# Patient Record
Sex: Female | Born: 1960 | Race: White | Hispanic: No | Marital: Married | Smoking: Former smoker
Health system: Southern US, Community
[De-identification: ages and names within clinical notes are randomized; demographics above are authoritative.]

## PROBLEM LIST (undated history)

## (undated) DIAGNOSIS — K219 Gastro-esophageal reflux disease without esophagitis: Secondary | ICD-10-CM

## (undated) DIAGNOSIS — D649 Anemia, unspecified: Secondary | ICD-10-CM

## (undated) DIAGNOSIS — M069 Rheumatoid arthritis, unspecified: Secondary | ICD-10-CM

## (undated) DIAGNOSIS — J449 Chronic obstructive pulmonary disease, unspecified: Secondary | ICD-10-CM

## (undated) DIAGNOSIS — F419 Anxiety disorder, unspecified: Secondary | ICD-10-CM

## (undated) DIAGNOSIS — I10 Essential (primary) hypertension: Secondary | ICD-10-CM

## (undated) DIAGNOSIS — N200 Calculus of kidney: Secondary | ICD-10-CM

## (undated) DIAGNOSIS — K649 Unspecified hemorrhoids: Secondary | ICD-10-CM

## (undated) DIAGNOSIS — G709 Myoneural disorder, unspecified: Secondary | ICD-10-CM

## (undated) DIAGNOSIS — F32A Depression, unspecified: Secondary | ICD-10-CM

## (undated) DIAGNOSIS — E785 Hyperlipidemia, unspecified: Secondary | ICD-10-CM

## (undated) DIAGNOSIS — F329 Major depressive disorder, single episode, unspecified: Secondary | ICD-10-CM

## (undated) DIAGNOSIS — E079 Disorder of thyroid, unspecified: Secondary | ICD-10-CM

## (undated) HISTORY — PX: SPINE SURGERY: SHX786

## (undated) HISTORY — DX: Gastro-esophageal reflux disease without esophagitis: K21.9

## (undated) HISTORY — PX: TUBAL LIGATION: SHX77

## (undated) HISTORY — DX: Disorder of thyroid, unspecified: E07.9

## (undated) HISTORY — DX: Anxiety disorder, unspecified: F41.9

## (undated) HISTORY — PX: SHOULDER SURGERY: SHX246

## (undated) HISTORY — DX: Depression, unspecified: F32.A

## (undated) HISTORY — DX: Hyperlipidemia, unspecified: E78.5

## (undated) HISTORY — DX: Rheumatoid arthritis, unspecified: M06.9

## (undated) HISTORY — PX: OTHER SURGICAL HISTORY: SHX169

## (undated) HISTORY — DX: Myoneural disorder, unspecified: G70.9

## (undated) HISTORY — DX: Unspecified hemorrhoids: K64.9

## (undated) HISTORY — DX: Calculus of kidney: N20.0

## (undated) HISTORY — DX: Chronic obstructive pulmonary disease, unspecified: J44.9

## (undated) HISTORY — DX: Essential (primary) hypertension: I10

## (undated) HISTORY — PX: LAPAROTOMY: SHX154

## (undated) HISTORY — PX: BACK SURGERY: SHX140

## (undated) HISTORY — PX: APPENDECTOMY: SHX54

## (undated) HISTORY — PX: CHOLECYSTECTOMY: SHX55

## (undated) HISTORY — DX: Anemia, unspecified: D64.9

## (undated) HISTORY — PX: WISDOM TOOTH EXTRACTION: SHX21

---
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
## 2009-02-01 ENCOUNTER — Emergency Department (HOSPITAL_COMMUNITY): Admission: EM | Admit: 2009-02-01 | Discharge: 2009-02-01 | Payer: Self-pay | Admitting: Emergency Medicine

## 2014-07-17 HISTORY — PX: COLONOSCOPY: SHX174

## 2015-09-11 ENCOUNTER — Other Ambulatory Visit: Payer: Self-pay | Admitting: Endocrinology

## 2015-09-11 DIAGNOSIS — E049 Nontoxic goiter, unspecified: Secondary | ICD-10-CM

## 2015-09-16 ENCOUNTER — Other Ambulatory Visit: Payer: Self-pay

## 2017-06-30 ENCOUNTER — Other Ambulatory Visit: Payer: Self-pay | Admitting: Endocrinology

## 2017-06-30 DIAGNOSIS — E039 Hypothyroidism, unspecified: Secondary | ICD-10-CM

## 2017-06-30 DIAGNOSIS — E049 Nontoxic goiter, unspecified: Secondary | ICD-10-CM

## 2017-07-12 ENCOUNTER — Ambulatory Visit
Admission: RE | Admit: 2017-07-12 | Discharge: 2017-07-12 | Disposition: A | Discharge status: Home / Self Care | Payer: 59 | Source: Ambulatory Visit | Attending: Endocrinology | Admitting: Endocrinology

## 2017-07-12 DIAGNOSIS — E039 Hypothyroidism, unspecified: Secondary | ICD-10-CM

## 2017-07-12 DIAGNOSIS — E049 Nontoxic goiter, unspecified: Secondary | ICD-10-CM

## 2019-07-19 ENCOUNTER — Telehealth: Payer: Self-pay

## 2019-07-19 NOTE — Telephone Encounter (Signed)
Left message to please call  Back. Also called PCP office @ [phone]  to get records faxed, left message on Otila Kluver (nurse) voice mail to please call back. Dr. Havery Moros, does this patient need an office visit with you before her colonoscopy or a Direct book ?

## 2019-07-19 NOTE — Telephone Encounter (Signed)
This patient is Dr. Donneta Romberg Mother-in-law and he called me yesterday and asked me to schedule her for a colonoscopy during the week of 07/31/19 - 08/04/19 with you. I thought the two of you had talked.

## 2019-07-19 NOTE — Telephone Encounter (Signed)
Got it, thanks yes that makes sense now. We did speak about it, I did not know her name at the time. Thanks for letting me know, okay to proceed with colonoscopy. Thanks

## 2019-07-19 NOTE — Telephone Encounter (Signed)
Patient scheduled for Colonoscopy on 08/04/19 at 9:30am with Dr. Havery Moros. Pre-visit on 07/31/19 patient to arrive @ 1:45pm. COVID testing on 08/02/19 at 9:30am at [address]. Patient aware of all the above

## 2019-07-19 NOTE — Telephone Encounter (Signed)
Allison [name] I am a bit confused. Is this patient being referred directly for a colonoscopy? I have not seen her before. If no significant comorbidities can direct book for colonoscopy. If for symptoms or something specifically bothering her she will need to see me in the office first. Thanks

## 2019-07-31 ENCOUNTER — Ambulatory Visit (AMBULATORY_SURGERY_CENTER): Payer: 59 | Admitting: *Deleted

## 2019-07-31 ENCOUNTER — Other Ambulatory Visit: Payer: Self-pay

## 2019-07-31 VITALS — Temp 97.0°F | Ht 62.0 in | Wt 193.0 lb

## 2019-07-31 DIAGNOSIS — Z1211 Encounter for screening for malignant neoplasm of colon: Secondary | ICD-10-CM

## 2019-07-31 DIAGNOSIS — Z1159 Encounter for screening for other viral diseases: Secondary | ICD-10-CM

## 2019-07-31 MED ORDER — SUTAB 1479-225-188 MG PO TABS: 1.0000 | ORAL_TABLET | ORAL | 0 refills | Status: DC

## 2019-07-31 NOTE — Progress Notes (Signed)
Patient denies any allergies to egg or soy products. Patient denies complications with anesthesia/sedation.  Patient denies oxygen use at home and denies diet medications. Emmi instructions for colonoscopy/endoscopy explained and given to patient.   

## 2019-08-02 ENCOUNTER — Ambulatory Visit (INDEPENDENT_AMBULATORY_CARE_PROVIDER_SITE_OTHER): Payer: 59

## 2019-08-02 ENCOUNTER — Encounter: Payer: Self-pay | Admitting: Gastroenterology

## 2019-08-02 DIAGNOSIS — Z1159 Encounter for screening for other viral diseases: Secondary | ICD-10-CM

## 2019-08-02 LAB — SARS CORONAVIRUS 2 (TAT 6-24 HRS): SARS Coronavirus 2: NEGATIVE

## 2019-08-04 ENCOUNTER — Encounter: Payer: Self-pay | Admitting: Gastroenterology

## 2019-08-04 ENCOUNTER — Ambulatory Visit (AMBULATORY_SURGERY_CENTER): Payer: 59 | Admitting: Gastroenterology

## 2019-08-04 ENCOUNTER — Other Ambulatory Visit: Payer: Self-pay

## 2019-08-04 ENCOUNTER — Telehealth: Payer: Self-pay | Admitting: Gastroenterology

## 2019-08-04 VITALS — BP 112/64 | HR 63 | Temp 97.3°F | Resp 18 | Ht 62.0 in | Wt 193.0 lb

## 2019-08-04 DIAGNOSIS — D123 Benign neoplasm of transverse colon: Secondary | ICD-10-CM

## 2019-08-04 DIAGNOSIS — K635 Polyp of colon: Secondary | ICD-10-CM | POA: Diagnosis not present

## 2019-08-04 DIAGNOSIS — Z86010 Personal history of colonic polyps: Secondary | ICD-10-CM

## 2019-08-04 DIAGNOSIS — K621 Rectal polyp: Secondary | ICD-10-CM

## 2019-08-04 DIAGNOSIS — D128 Benign neoplasm of rectum: Secondary | ICD-10-CM

## 2019-08-04 DIAGNOSIS — D122 Benign neoplasm of ascending colon: Secondary | ICD-10-CM

## 2019-08-04 MED ORDER — SODIUM CHLORIDE 0.9 % IV SOLN: 500.0000 mL | Freq: Once | INTRAVENOUS | Status: DC

## 2019-08-04 MED ORDER — AMBULATORY NON FORMULARY MEDICATION: 1 refills | Status: DC

## 2019-08-04 MED ORDER — AMBULATORY NON FORMULARY MEDICATION: 2 refills | Status: DC

## 2019-08-04 NOTE — Telephone Encounter (Signed)
Pts NTG gel was sent to wrong pharmacy resent and rx for Nitrogylcerin gel printed for her to take to [facility]. Patient is coming back to pickup the rx.

## 2019-08-04 NOTE — Op Note (Signed)
[facility] Patient Name: Allison [name] Procedure Date: 08/04/2019 9:47 AM MRN: [id number] Endoscopist: Remo Lipps P. Havery Moros , MD Age: 59 Referring MD:  Date of Birth: [birth year] Gender: Female Account #: [id number] Procedure:                Colonoscopy Indications:              High risk colon cancer surveillance: Personal                            history of colonic polyps (adenoma removed in 2016                            - 1cm in size) Medicines:                Monitored Anesthesia Care Procedure:                Pre-Anesthesia Assessment:                           - Prior to the procedure, a History and Physical                            was performed, and patient medications and                            allergies were reviewed. The patient's tolerance of                            previous anesthesia was also reviewed. The risks                            and benefits of the procedure and the sedation                            options and risks were discussed with the patient.                            All questions were answered, and informed consent                            was obtained. Prior Anticoagulants: The patient has                            taken no previous anticoagulant or antiplatelet                            agents. ASA Grade Assessment: II - A patient with                            mild systemic disease. After reviewing the risks                            and benefits, the patient was deemed in  satisfactory condition to undergo the procedure.                           After obtaining informed consent, the colonoscope                            was passed under direct vision. Throughout the                            procedure, the patient's blood pressure, pulse, and                            oxygen saturations were monitored continuously. The                            Colonoscope was introduced through the anus  and                            advanced to the the terminal ileum, with                            identification of the appendiceal orifice and IC                            valve. The colonoscopy was performed without                            difficulty. The patient tolerated the procedure                            well. The quality of the bowel preparation was                            good. The terminal ileum, ileocecal valve,                            appendiceal orifice, and rectum were photographed. Scope In: 9:50:58 AM Scope Out: 10:26:53 AM Scope Withdrawal Time: 0 hours 30 minutes 30 seconds  Total Procedure Duration: 0 hours 35 minutes 55 seconds  Findings:                 Skin tags were found on perianal exam, one large.                            Small anal fissure noted as well.                           The terminal ileum appeared normal.                           A 10 mm polyp was found in the ascending colon. The                            polyp was sessile and located just distal to the  IC                            valve on the back side of a fold. Positioning was                            challenging to be able to grasp and remove it with                            regular snare, could not be seen in retroflexed                            views. Captivator snare was ultimately used to be                            able to grasp it and then removed successfully.                            Resection and retrieval were complete.                           A 6 mm polyp was found in the transverse colon. The                            polyp was flat. The polyp was removed with a cold                            snare. Resection and retrieval were complete.                           Two sessile polyps were found in the rectum. The                            polyps were 3 mm in size. These polyps were removed                            with a cold snare. Resection and  retrieval were                            complete.                           Multiple small-mouthed diverticula were found in                            the sigmoid colon.                           Internal hemorrhoids were found during                            retroflexion. The hemorrhoids were small. Small  anal fissure visualized endoscopically during                            withdrawal.                           The exam was otherwise without abnormality. Complications:            No immediate complications. Estimated blood loss:                            Minimal. Estimated Blood Loss:     Estimated blood loss was minimal. Impression:               - Perianal skin tags found on perianal exam.                           - Small anal fissure                           - The examined portion of the ileum was normal.                           - One 10 mm polyp in the ascending colon, removed                            with a cold snare. Resected and retrieved.                           - One 6 mm polyp in the transverse colon, removed                            with a cold snare. Resected and retrieved.                           - Two 3 mm polyps in the rectum, removed with a                            cold snare. Resected and retrieved.                           - Diverticulosis in the sigmoid colon.                           - Internal hemorrhoids.                           - The examination was otherwise normal. Recommendation:           - Patient has a contact number available for                            emergencies. The signs and symptoms of potential                            delayed complications were discussed with the  patient. Return to normal activities tomorrow.                            Written discharge instructions were provided to the                            patient.                           - Resume  previous diet.                           - Continue present medications.                           - Await pathology results.                           - Recommend topical 0.125% nitroglycerin ointment                            to treat fissure if bleeding symptoms / pain                            bothering the patient. Remo Lipps P. Armbruster, MD 08/04/2019 10:36:22 AM This report has been signed electronically.

## 2019-08-04 NOTE — Progress Notes (Signed)
A and O x3. Report to RN. Tolerated MAC anesthesia well.

## 2019-08-04 NOTE — Progress Notes (Signed)
Called to room to assist during endoscopic procedure.  Patient ID and intended procedure confirmed with present staff. Received instructions for my participation in the procedure from the performing physician.  

## 2019-08-04 NOTE — Progress Notes (Signed)
Pt's states no medical or surgical changes since previsit or office visit. 

## 2019-08-04 NOTE — Progress Notes (Signed)
LC- temp CW- vitals

## 2019-08-04 NOTE — Telephone Encounter (Signed)
Please send nitroglycerin rx to [facility].  It had been sent to [facility].

## 2019-08-04 NOTE — Patient Instructions (Signed)
Impression/Recommendations:  Polyp handout given to patient. Diverticulosis handout given to patient. Hemorrhoid handout given to patient.  Resume previous diet. Continue present medications.  Await pathology results.  Recommend topical 0.125% nitroglycerin ointment to treat fissure if bleeding symptoms/pain bother the patient.  YOU HAD AN ENDOSCOPIC PROCEDURE TODAY AT [facility]:   Refer to the procedure report that was given to you for any specific questions about what was found during the examination.  If the procedure report does not answer your questions, please call your gastroenterologist to clarify.  If you requested that your care partner not be given the details of your procedure findings, then the procedure report has been included in a sealed envelope for you to review at your convenience later.  YOU SHOULD EXPECT: Some feelings of bloating in the abdomen. Passage of more gas than usual.  Walking can help get rid of the air that was put into your GI tract during the procedure and reduce the bloating. If you had a lower endoscopy (such as a colonoscopy or flexible sigmoidoscopy) you may notice spotting of blood in your stool or on the toilet paper. If you underwent a bowel prep for your procedure, you may not have a normal bowel movement for a few days.  Please Note:  You might notice some irritation and congestion in your nose or some drainage.  This is from the oxygen used during your procedure.  There is no need for concern and it should clear up in a day or so.  SYMPTOMS TO REPORT IMMEDIATELY:   Following lower endoscopy (colonoscopy or flexible sigmoidoscopy):  Excessive amounts of blood in the stool  Significant tenderness or worsening of abdominal pains  Swelling of the abdomen that is new, acute  Fever of 100F or higher  For urgent or emergent issues, a gastroenterologist can be reached at any hour by calling [phone]. Do not use MyChart  messaging for urgent concerns.    DIET:  We do recommend a small meal at first, but then you may proceed to your regular diet.  Drink plenty of fluids but you should avoid alcoholic beverages for 24 hours.  ACTIVITY:  You should plan to take it easy for the rest of today and you should NOT DRIVE or use heavy machinery until tomorrow (because of the sedation medicines used during the test).    FOLLOW UP: Our staff will call the number listed on your records 48-72 hours following your procedure to check on you and address any questions or concerns that you may have regarding the information given to you following your procedure. If we do not reach you, we will leave a message.  We will attempt to reach you two times.  During this call, we will ask if you have developed any symptoms of COVID 19. If you develop any symptoms (ie: fever, flu-like symptoms, shortness of breath, cough etc.) before then, please call [phone].  If you test positive for Covid 19 in the 2 weeks post procedure, please call and report this information to [place].    If any biopsies were taken you will be contacted by phone or by letter within the next 1-3 weeks.  Please call us at [phone] if you have not heard about the biopsies in 3 weeks.    SIGNATURES/CONFIDENTIALITY: You and/or your care partner have signed paperwork which will be entered into your electronic medical record.  These signatures attest to the fact that that the information above on your  After Visit Summary has been reviewed and is understood.  Full responsibility of the confidentiality of this discharge information lies with you and/or your care-partner.

## 2019-08-04 NOTE — Addendum Note (Signed)
Addended by: Lexine Baton E on: 08/04/2019 04:24 PM   Modules accepted: Orders

## 2019-08-08 ENCOUNTER — Telehealth: Payer: Self-pay

## 2019-08-08 NOTE — Telephone Encounter (Signed)
  Follow up Call-  Call back number 08/04/2019  Post procedure Call Back phone  # [phone]  Permission to leave phone message Yes  Some recent data might be hidden     Patient questions:  Do you have a fever, pain , or abdominal swelling? No. Pain Score  0 *  Have you tolerated food without any problems? Yes.    Have you been able to return to your normal activities? Yes.    Do you have any questions about your discharge instructions: Diet   No. Medications  No. Follow up visit  No.  Do you have questions or concerns about your Care? No.  Actions: * If pain score is 4 or above: No action needed, pain <4.  1. Have you developed a fever since your procedure? no  2.   Have you had an respiratory symptoms (SOB or cough) since your procedure? no  3.   Have you tested positive for COVID 19 since your procedure no  4.   Have you had any family members/close contacts diagnosed with the COVID 19 since your procedure?  no   If yes to any of these questions please route to Joylene John, RN and Alphonsa Gin, Therapist, sports.

## 2020-10-22 ENCOUNTER — Ambulatory Visit: Payer: 59 | Admitting: Family Medicine

## 2020-10-23 NOTE — Progress Notes (Signed)
[place] Phone: [phone] Subjective:   Allison [name], am serving as a scribe for Dr. Hulan Saas. This visit occurred during the SARS-CoV-2 public health emergency.  Safety protocols were in place, including screening questions prior to the visit, additional usage of staff PPE, and extensive cleaning of exam room while observing appropriate contact time as indicated for disinfecting solutions.    I'm seeing this patient by the request  of:  Hodnett, Jacobo Forest, FNP  CC: Hand pain and swelling and multiple other complaints.  XNA:[id number]  Allison [name] is a 60 y.o. female coming in with complaint of B hand, R>L. Wakes up in morning and fingers will be flexed slightly in most joints. Has noticed swelling in hands for past year that is getting worse. Notes numbness in R thumb. Does still work and uses computer throughout the day. Has been trying compression braces, ice, topical analgesic and NSAIDS for pain relief. Patient had ulnar nerve moved over 10 years ago on R side. Does still have ulnar nerve pain. History of bursitis surgery over 10 years ago R shoulder. History of injections in neck by neurologist.  Patient is looking for anything to potentially help.  Has been on different medications.  At this point he is taking Cymbalta and 20 mg twice daily, gabapentin.  Also notes swelling in ankles and toes that gets worse throughout the day since December 2021.   Patient did have labs previously that did show patient had an elevated rheumatoid factor.  Patient's level was 48 drawn on Oct 06, 2020  Past Medical History:  Diagnosis Date   Anxiety    Depression    GERD (gastroesophageal reflux disease)    diet controlled , tums occasional   Hemorrhoids    Hyperlipidemia    Kidney stones    Thyroid disease    Past Surgical History:  Procedure Laterality Date   APPENDECTOMY     arm surgery Right    nerve moved    BACK SURGERY     x 2 - lumbar - replaced 2 disc   CHOLECYSTECTOMY     COLONOSCOPY  07/2014   hx polyps [place], [place]   kidney stones     x 2 - removed by surgery   LAPAROTOMY     cyst removed from ovary   TUBAL LIGATION     WISDOM TOOTH EXTRACTION     Social History   Socioeconomic History   Marital status: Married    Spouse name: Not on file   Number of children: Not on file   Years of education: Not on file   Highest education level: Not on file  Occupational History   Not on file  Tobacco Use   Smoking status: Former    Packs/day: 0.50    Years: 40.00    Pack years: 20.00    Types: Cigarettes    Quit date: 2020    Years since quitting: 2.4   Smokeless tobacco: Never  Vaping Use   Vaping Use: Never used  Substance and Sexual Activity   Alcohol use: Yes    Comment: socially   Drug use: Never   Sexual activity: Not on file  Other Topics Concern   Not on file  Social History Narrative   Not on file   Social Determinants of Health   Financial Resource Strain: Not on file  Food Insecurity: Not on file  Transportation Needs: Not on file  Physical Activity: Not on file  Stress: Not on file  Social Connections: Not on file   Allergies  Allergen Reactions   Ezetimibe Other (See Comments)   Latex Itching   Penicillin G Hives   Sulfa Antibiotics Hives   Family History  Problem Relation Age of Onset   Colon cancer Neg Hx    Rectal cancer Neg Hx    Stomach cancer Neg Hx     Current Outpatient Medications (Endocrine & Metabolic):    levothyroxine (SYNTHROID) 175 MCG tablet, 1 tab(s)  Current Outpatient Medications (Cardiovascular):    atorvastatin (LIPITOR) 80 MG tablet, TAKE 1 TABLET BY MOUTH ONE TIME DAILY AT BEDTIME   Enalapril-hydroCHLOROthiazide 5-12.5 MG tablet, 1 tab(s)   Current Outpatient Medications (Analgesics):    aspirin 81 MG EC tablet, TAKE 1 TABLET BY MOUTH EVERY DAY   Current Outpatient Medications (Other):    ALPRAZolam (XANAX)  0.5 MG tablet, 1 tab(s)   DULoxetine (CYMBALTA) 20 MG capsule, Take 20 mg by mouth daily.   gabapentin (NEURONTIN) 300 MG/6ML solution, 1 cap(s)   sertraline (ZOLOFT) 100 MG tablet, 1 tab(s)   AMBULATORY NON FORMULARY MEDICATION, Medication Name: Nitroglycerin 0.125% gel. Apply pea size amount to the rectum three times a day for 6-8 weeks   hydrocortisone (ANUCORT-HC) 25 MG suppository, 1 SUPP(s)   Reviewed prior external information including notes and imaging from  primary care provider As well as notes that were available from care everywhere and other healthcare systems.  Past medical history, social, surgical and family history all reviewed in electronic medical record.  [name] pertanent information unless stated regarding to the chief complaint.   Review of Systems:  [name] headache, visual changes, nausea, vomiting, diarrhea, constipation, dizziness, abdominal pain, skin rash, fevers, chills, night sweats, weight loss, swollen lymph nodes, chest pain, shortness of breath, mood changes. POSITIVE muscle aches, body aches, joint swelling of the hands  Objective  Blood pressure 134/84, pulse 93, height 5\' 2"  (1.575 m), weight 189 lb (85.7 kg), SpO2 97 %.   General: [name] apparent distress alert and oriented x3 mood and affect normal, dressed appropriately.  HEENT: Pupils equal, extraocular movements intact  Respiratory: Patient's speak in full sentences and does not appear short of breath  Cardiovascular: [name] lower extremity edema, non tender, [name] erythema  Gait normal with good balance and coordination.  MSK: Patient's hand exam bilaterally show the patient.  The right elbow did have a formal surgery  Patient still has tenderness in the forearm.  Mild positive Tinel's test bilaterally. [name] significant swelling noted at this time. Decent grip strength. Patient's neck exam does have fairly good range of motion while patient is talking.  Seems to have some very mild limited sidebending.  Limited  musculoskeletal ultrasound was performed and interpreted by Lyndal Pulley  Limited ultrasound of patient's wrist bilaterally show [name] significant bony abnormality.  [name] synovitis noted.  Patient does have very mild enlargement of the median nerve bilaterally.  Patient does have what appears to be hypoechoic changes surrounding the median nerve on the right side that is consistent with potential scarring.  Otherwise ultrasound fairly unremarkable. Impression: Questionable carpal tunnel bilaterally   Impression and Recommendations:     The above documentation has been reviewed and is accurate and complete Lyndal Pulley, DO

## 2020-10-24 ENCOUNTER — Ambulatory Visit: Payer: Self-pay

## 2020-10-24 ENCOUNTER — Other Ambulatory Visit: Payer: Self-pay

## 2020-10-24 ENCOUNTER — Ambulatory Visit (INDEPENDENT_AMBULATORY_CARE_PROVIDER_SITE_OTHER): Payer: 59 | Admitting: Family Medicine

## 2020-10-24 ENCOUNTER — Ambulatory Visit (INDEPENDENT_AMBULATORY_CARE_PROVIDER_SITE_OTHER): Payer: 59

## 2020-10-24 ENCOUNTER — Encounter: Payer: Self-pay | Admitting: Family Medicine

## 2020-10-24 VITALS — BP 134/84 | HR 93 | Ht 62.0 in | Wt 189.0 lb

## 2020-10-24 DIAGNOSIS — M25532 Pain in left wrist: Secondary | ICD-10-CM

## 2020-10-24 DIAGNOSIS — M542 Cervicalgia: Secondary | ICD-10-CM | POA: Diagnosis not present

## 2020-10-24 DIAGNOSIS — M2550 Pain in unspecified joint: Secondary | ICD-10-CM | POA: Diagnosis not present

## 2020-10-24 DIAGNOSIS — M25531 Pain in right wrist: Secondary | ICD-10-CM | POA: Diagnosis not present

## 2020-10-24 DIAGNOSIS — M25539 Pain in unspecified wrist: Secondary | ICD-10-CM | POA: Insufficient documentation

## 2020-10-24 NOTE — Patient Instructions (Signed)
Good to see you.  Labs today B EMG UE Exercises [email or web address] Turmeric 500mg  daily  Tart cherry extract 1200mg  at night Vitamin D 2000 IU daily  See me again in 4-6 weeks

## 2020-10-24 NOTE — Assessment & Plan Note (Signed)
Patient has many different aches and pains.  Has had an elevated rheumatoid factor previously and I would like to recheck labs when further labs to see if anything else is potentially abnormal.  We discussed the potential for the possibility of an injection in the carpal tunnel which patient at this moment would rather go with a nerve conduction test.  I think this is reasonable.  Patient's ultrasound did not show significant enlargement of the median nerves bilaterally but did have some potential scarring right greater than left.  We did discuss the potential for changing medications such as the Cymbalta and increasing this and decreasing her sertraline.  At the moment we will hold and try more of the counter natural supplementations and see how patient will respond.  Patient continue to be active otherwise.  Discussed different compression sleeves and home exercises and follow-up again in 4 to 8 weeks

## 2020-10-24 NOTE — Assessment & Plan Note (Signed)
Bilateral.  Discussed potential carpal tunnel, awaiting new nerve conduction test.  Has also had cervical radiculopathy in her past and has had epidurals in there previously.  Patient is already on gabapentin.  Will do compression.  Worsening pain and if nerve conduction shows more carpal tunnel will do injection at follow-up

## 2020-10-25 ENCOUNTER — Encounter: Payer: Self-pay | Admitting: Neurology

## 2020-10-25 LAB — CBC WITH DIFFERENTIAL/PLATELET
Basophils Absolute: 0.1 10*3/uL (ref 0.0–0.1)
Basophils Relative: 1 % (ref 0.0–3.0)
Eosinophils Absolute: 0.4 10*3/uL (ref 0.0–0.7)
Eosinophils Relative: 3.5 % (ref 0.0–5.0)
HCT: 42.8 % (ref 36.0–46.0)
Hemoglobin: 14.6 g/dL (ref 12.0–15.0)
Lymphocytes Relative: 37.2 % (ref 12.0–46.0)
Lymphs Abs: 4 10*3/uL (ref 0.7–4.0)
MCHC: 34.1 g/dL (ref 30.0–36.0)
MCV: 81.1 fl (ref 78.0–100.0)
Monocytes Absolute: 0.8 10*3/uL (ref 0.1–1.0)
Monocytes Relative: 8 % (ref 3.0–12.0)
Neutro Abs: 5.4 10*3/uL (ref 1.4–7.7)
Neutrophils Relative %: 50.3 % (ref 43.0–77.0)
Platelets: 295 10*3/uL (ref 150.0–400.0)
RBC: 5.28 Mil/uL — ABNORMAL HIGH (ref 3.87–5.11)
RDW: 13.8 % (ref 11.5–15.5)
WBC: 10.6 10*3/uL — ABNORMAL HIGH (ref 4.0–10.5)

## 2020-10-25 LAB — COMPREHENSIVE METABOLIC PANEL
ALT: 20 U/L (ref 0–35)
AST: 15 U/L (ref 0–37)
Albumin: 4.3 g/dL (ref 3.5–5.2)
Alkaline Phosphatase: 89 U/L (ref 39–117)
BUN: 20 mg/dL (ref 6–23)
CO2: 24 mEq/L (ref 19–32)
Calcium: 9.8 mg/dL (ref 8.4–10.5)
Chloride: 108 mEq/L (ref 96–112)
Creatinine, Ser: 0.84 mg/dL (ref 0.40–1.20)
GFR: 74.63 mL/min (ref >60.00)
Glucose, Bld: 76 mg/dL (ref 70–99)
Potassium: 4 mEq/L (ref 3.5–5.1)
Sodium: 142 mEq/L (ref 135–145)
Total Bilirubin: 0.2 mg/dL (ref 0.2–1.2)
Total Protein: 7.5 g/dL (ref 6.0–8.3)

## 2020-10-25 LAB — SEDIMENTATION RATE: Sed Rate: 24 mm/hr (ref 0–30)

## 2020-10-25 LAB — C-REACTIVE PROTEIN: CRP: <1 mg/dL (ref 0.5–20.0)

## 2020-10-25 LAB — IBC PANEL
Iron: 28 ug/dL — ABNORMAL LOW (ref 42–145)
Saturation Ratios: 7.8 % — ABNORMAL LOW (ref 20.0–50.0)
Transferrin: 258 mg/dL (ref 212.0–360.0)

## 2020-10-25 LAB — URIC ACID: Uric Acid, Serum: 3.9 mg/dL (ref 2.4–7.0)

## 2020-10-25 LAB — VITAMIN D 25 HYDROXY (VIT D DEFICIENCY, FRACTURES): VITD: 28.5 ng/mL — ABNORMAL LOW (ref 30.00–100.00)

## 2020-10-25 LAB — TSH: TSH: 0.19 u[IU]/mL — ABNORMAL LOW (ref 0.35–4.50)

## 2020-10-26 LAB — ANTI-NUCLEAR AB-TITER (ANA TITER)
ANA TITER: 1:40 {titer} — ABNORMAL HIGH
ANA Titer 1: 1:40 {titer} — ABNORMAL HIGH

## 2020-10-26 LAB — CALCIUM, IONIZED: Calcium, Ion: 5.22 mg/dL (ref 4.8–5.6)

## 2020-10-26 LAB — PTH, INTACT AND CALCIUM
Calcium: 10.1 mg/dL (ref 8.6–10.4)
PTH: 25 pg/mL (ref 16–77)

## 2020-10-26 LAB — ANGIOTENSIN CONVERTING ENZYME: Angiotensin-Converting Enzyme: 52 U/L (ref 9–67)

## 2020-10-26 LAB — ANA: Anti Nuclear Antibody (ANA): POSITIVE — AB

## 2020-10-26 LAB — CYCLIC CITRUL PEPTIDE ANTIBODY, IGG: Cyclic Citrullin Peptide Ab: >250 UNITS — ABNORMAL HIGH

## 2020-10-26 LAB — RHEUMATOID FACTOR: Rheumatoid fact SerPl-aCnc: 268 IU/mL — ABNORMAL HIGH (ref <14)

## 2020-10-27 ENCOUNTER — Encounter: Payer: Self-pay | Admitting: Family Medicine

## 2020-10-28 ENCOUNTER — Other Ambulatory Visit: Payer: Self-pay

## 2020-10-28 ENCOUNTER — Encounter: Payer: Self-pay | Admitting: Family Medicine

## 2020-10-28 DIAGNOSIS — R768 Other specified abnormal immunological findings in serum: Secondary | ICD-10-CM

## 2020-10-28 DIAGNOSIS — R9389 Abnormal findings on diagnostic imaging of other specified body structures: Secondary | ICD-10-CM

## 2020-11-29 ENCOUNTER — Ambulatory Visit
Admission: RE | Admit: 2020-11-29 | Discharge: 2020-11-29 | Disposition: A | Discharge status: Home / Self Care | Payer: 59 | Source: Ambulatory Visit | Attending: Family Medicine | Admitting: Family Medicine

## 2020-11-29 DIAGNOSIS — R9389 Abnormal findings on diagnostic imaging of other specified body structures: Secondary | ICD-10-CM

## 2020-12-03 ENCOUNTER — Ambulatory Visit (INDEPENDENT_AMBULATORY_CARE_PROVIDER_SITE_OTHER): Payer: 59 | Admitting: Neurology

## 2020-12-03 ENCOUNTER — Other Ambulatory Visit: Payer: Self-pay

## 2020-12-03 DIAGNOSIS — R202 Paresthesia of skin: Secondary | ICD-10-CM

## 2020-12-03 NOTE — Procedures (Signed)
[facility]  [place], [place]  [place], [place] [address] Tel: [phone] Fax:  [phone] Test Date:  12/03/2020  Patient: Allison [name] DOB: Mar 09, 1961 Physician: Narda Amber, DO  Sex: Female Height: 5\' 2"  Ref Phys: Hulan Saas, DO  ID#: [id number]   Technician:    Patient Complaints: This is a 60 year old female referred for evaluation of bilateral hand and wrist pain.  NCV & EMG Findings: Extensive electrodiagnostic testing of the right upper extremity and additional studies of the left shows: Bilateral median, ulnar, and mixed palmar sensory responses are within normal limits. Bilateral median and ulnar motor responses are within normal limits. There is no evidence of active or chronic motor axonal loss changes affecting any of the tested muscles.  Motor unit configuration and recruitment pattern is within normal limits.  Impression: This is a normal study of the upper extremities.  In particular, there is no evidence of carpal tunnel syndrome, ulnar neuropathy, or a cervical radiculopathy.   ___________________________ Narda Amber, DO    Nerve Conduction Studies Anti Sensory Summary Table   Stim Site NR Peak (ms) Norm Peak (ms) P-T Amp (V) Norm P-T Amp  Left Median Anti Sensory (2nd Digit)  35C  Wrist    2.6 <3.8 30.2 >10  Right Median Anti Sensory (2nd Digit)  35C  Wrist    2.6 <3.8 26.3 >10  Left Ulnar Anti Sensory (5th Digit)  35C  Wrist    2.3 <3.2 27.7 >5  Right Ulnar Anti Sensory (5th Digit)  35C  Wrist    2.1 <3.2 29.3 >5   Motor Summary Table   Stim Site NR Onset (ms) Norm Onset (ms) O-P Amp (mV) Norm O-P Amp Site1 Site2 Delta-0 (ms) Dist (cm) Vel (m/s) Norm Vel (m/s)  Left Median Motor (Abd Poll Brev)  35C  Wrist    2.7 <4.0 8.7 >5 Elbow Wrist 4.4 27.0 61 >50  Elbow    7.1  8.7         Right Median Motor (Abd Poll Brev)  35C  Wrist    3.0 <4.0 8.6 >5 Elbow Wrist 5.0 28.0 56 >50  Elbow    8.0  8.2         Left Ulnar Motor  (Abd Dig Minimi)  35C  Wrist    1.8 <3.1 9.4 >7 B Elbow Wrist 3.1 20.0 65 >50  B Elbow    4.9  8.7  A Elbow B Elbow 1.7 10.0 59 >50  A Elbow    6.6  8.7         Right Ulnar Motor (Abd Dig Minimi)  35C  Wrist    2.0 <3.1 8.8 >7 B Elbow Wrist 3.2 20.0 63 >50  B Elbow    5.2  7.0  A Elbow B Elbow 1.3 9.0 69 >50  A Elbow    6.5  6.9          Comparison Summary Table   Stim Site NR Peak (ms) Norm Peak (ms) P-T Amp (V) Site1 Site2 Delta-P (ms) Norm Delta (ms)  Left Median/Ulnar Palm Comparison (Wrist - 8cm)  35C  Median Palm    1.6 <2.2 71.9 Median Palm Ulnar Palm 0.3   Ulnar Palm    1.3 <2.2 18.8      Right Median/Ulnar Palm Comparison (Wrist - 8cm)  35C  Median Palm    1.6 <2.2 71.8 Median Palm Ulnar Palm 0.3   Ulnar Palm    1.3 <2.2 25.7  EMG   Side Muscle Ins Act Fibs Psw Fasc Number Recrt Dur Dur. Amp Amp. Poly Poly. Comment  Left 1stDorInt Nml Nml Nml Nml Nml Nml Nml Nml Nml Nml Nml Nml N/A  Left PronatorTeres Nml Nml Nml Nml Nml Nml Nml Nml Nml Nml Nml Nml N/A  Left Biceps Nml Nml Nml Nml Nml Nml Nml Nml Nml Nml Nml Nml N/A  Left Triceps Nml Nml Nml Nml Nml Nml Nml Nml Nml Nml Nml Nml N/A  Left Deltoid Nml Nml Nml Nml Nml Nml Nml Nml Nml Nml Nml Nml N/A  Right PronatorTeres Nml Nml Nml Nml Nml Nml Nml Nml Nml Nml Nml Nml N/A  Right Biceps Nml Nml Nml Nml Nml Nml Nml Nml Nml Nml Nml Nml N/A  Right Triceps Nml Nml Nml Nml Nml Nml Nml Nml Nml Nml Nml Nml N/A  Right Deltoid Nml Nml Nml Nml Nml Nml Nml Nml Nml Nml Nml Nml N/A  Right 1stDorInt Nml Nml Nml Nml Nml Nml Nml Nml Nml Nml Nml Nml N/A     Waveforms:

## 2020-12-04 NOTE — Progress Notes (Signed)
Office Visit Note  Patient: Allison [name]             Date of Birth: 10-02-1960           MRN: [id number]             PCP: Dicie Beam, FNP Referring: Lyndal Pulley, DO Visit Date: 12/05/2020 Occupation: [facility]  Subjective:  New Patient (Initial Visit) (Patient complains of bilateral hand pain (right(dominant)>left) and bilateral foot pain. )   History of Present Illness: Allison [name] is a 60 y.o. female here for multiple joint pains including bilateral hands. She was also referred for NCS this did not demonstrate abnormalities consistent with her symptoms.  She has had joint pains to some degree for a while but particularly noticed increased pain and stiffness decreased range of motion and intermittent swelling in joints of several fingers during the past few months.  She is also noticed increase in bilateral pain on the bottoms of the feet that is worst first thing in the morning when weightbearing and improves walking throughout the day.  Labs reviewed 10/2020 ANA 1:40 speckled, cytoplasmic ACE 52 CBC unremarkable CMP wnl CRP wnl RF 268 CCP >250 TSH 0.19 Uric acid 3.9 Iron sat 7.8% PTH wnl  Activities of Daily Living:  Patient reports morning stiffness for 15 minutes.   Patient Reports nocturnal pain.  Difficulty dressing/grooming: Denies Difficulty climbing stairs: Denies Difficulty getting out of chair: Denies Difficulty using hands for taps, buttons, cutlery, and/or writing: Reports  Review of Systems  Constitutional:  Negative for fatigue.  HENT:  Negative for mouth sores, mouth dryness and nose dryness.   Eyes:  Negative for pain, itching, visual disturbance and dryness.  Respiratory:  Positive for cough. Negative for hemoptysis, shortness of breath and difficulty breathing.   Cardiovascular:  Positive for swelling in legs/feet. Negative for chest pain and palpitations.  Gastrointestinal:  Negative for abdominal pain, blood in stool, constipation and diarrhea.   Endocrine: Negative for increased urination.  Genitourinary:  Negative for painful urination.  Musculoskeletal:  Positive for joint pain, joint pain, joint swelling, myalgias, muscle weakness, morning stiffness, muscle tenderness and myalgias.  Skin:  Negative for color change, rash and redness.  Allergic/Immunologic: Negative for susceptible to infections.  Neurological:  Positive for numbness. Negative for dizziness, headaches, memory loss and weakness.  Hematological:  Negative for swollen glands.  Psychiatric/Behavioral:  Negative for confusion and sleep disturbance.    PMFS History:  Patient Active Problem List   Diagnosis Date Noted   Rheumatoid arthritis with rheumatoid factor of multiple sites without organ or systems involvement ([place]) 12/05/2020   Iron deficiency 12/05/2020   Plantar fasciitis, bilateral 12/05/2020   Positive ANA (antinuclear antibody) 12/05/2020   High risk medication use 12/05/2020   Polyarthralgia 10/24/2020   Wrist pain 10/24/2020    Past Medical History:  Diagnosis Date   Anxiety    Depression    GERD (gastroesophageal reflux disease)    diet controlled , tums occasional   Hemorrhoids    Hyperlipidemia    Kidney stones    Thyroid disease     Family History  Problem Relation Age of Onset   Asthma Mother    Heart disease Father    Osteoarthritis Sister    Heart disease Brother    Thyroid disease Daughter    Colon cancer Neg Hx    Rectal cancer Neg Hx    Stomach cancer Neg Hx    Past Surgical History:  Procedure Laterality Date  APPENDECTOMY     arm surgery Right    nerve moved   BACK SURGERY     x 2 - lumbar - replaced 2 disc   CHOLECYSTECTOMY     COLONOSCOPY  07/2014   hx polyps [place]   kidney stones     x 2 - removed by surgery   LAPAROTOMY     cyst removed from ovary   SHOULDER SURGERY Right    [place] EXTRACTION     Social History   Social History Narrative   Not on file    Immunization History  Administered Date(s) Administered   Moderna SARS-COV2 Booster Vaccination 04/07/2020, 09/06/2020   Moderna Sars-Covid-2 Vaccination 08/30/2019, 09/27/2019     Objective: Vital Signs: BP 103/68 (BP Location: Right Arm, Patient Position: Sitting, Cuff Size: Normal)   Pulse (!) 111   Ht 5\' 3"  (1.6 m)   Wt 187 lb 9.6 oz (85.1 kg)   BMI 33.23 kg/m    Physical Exam Constitutional:      Appearance: She is obese.  Eyes:     Conjunctiva/sclera: Conjunctivae normal.  Cardiovascular:     Rate and Rhythm: Normal rate and regular rhythm.  Pulmonary:     Effort: Pulmonary effort is normal.     Breath sounds: Normal breath sounds.  Skin:    General: Skin is warm and dry.     Findings: No rash.  Neurological:     General: No focal deficit present.     Mental Status: She is alert.  Psychiatric:        Mood and Affect: Mood normal.     Musculoskeletal Exam:  Shoulders full ROM no tenderness or swelling Elbows full ROM no tenderness or swelling Wrists full ROM no tenderness or swelling Fingers right second MCP decreased range of motion tenderness to palpation effusion and synovial hypertrophy present on ultrasound inspection with no color Doppler enhancement, other joints normal ROM nontender Knees full ROM no tenderness or swelling Ankles full ROM no tenderness or swelling Mild tenderness to pressure on sole of foot under margin and anterior border of calcaneus no palpable nodules Negative MTP squeeze tenderness   CDAI Exam: CDAI Score: -- Patient Global: --; Provider Global: -- Swollen: 1 ; Tender: 1  Joint Exam 12/05/2020      Right  Left  MCP 2  Swollen Tender        Investigation: No additional findings.  Imaging: US Carotid Bilateral  Result Date: 11/29/2020 CLINICAL DATA:  Hypertension, carotid atherosclerosis by plain radiography EXAM: BILATERAL CAROTID DUPLEX ULTRASOUND TECHNIQUE: Pearline Cables scale imaging, color Doppler and duplex ultrasound  were performed of bilateral carotid and vertebral arteries in the neck. COMPARISON:  10/24/2020 FINDINGS: Criteria: Quantification of carotid stenosis is based on velocity parameters that correlate the residual internal carotid diameter with NASCET-based stenosis levels, using the diameter of the distal internal carotid lumen as the denominator for stenosis measurement. The following velocity measurements were obtained: RIGHT ICA: 77/24 cm/sec CCA: 62/26 cm/sec SYSTOLIC ICA/CCA RATIO:  0.9 ECA: 141 cm/sec LEFT ICA: 96/22 cm/sec CCA: 333/54 cm/sec SYSTOLIC ICA/CCA RATIO:  1.0 ECA: 153 cm/sec RIGHT CAROTID ARTERY: Intimal thickening and heterogeneous scattered carotid atherosclerosis with calcification. Despite this, no hemodynamically significant right ICA stenosis, velocity elevation, turbulent flow. Degree of narrowing less than 50% by ultrasound criteria. RIGHT VERTEBRAL ARTERY:  Normal antegrade flow LEFT CAROTID ARTERY: Similar intimal thickening and scattered moderate heterogeneous calcified atherosclerosis. No significant left ICA stenosis, velocity  elevation, turbulent flow. Degree of narrowing also less than 50% by ultrasound criteria. LEFT VERTEBRAL ARTERY:  Normal antegrade flow Upper extremity blood pressures: RIGHT: 118/66 LEFT: 128/69 IMPRESSION: Bilateral carotid atherosclerosis. Negative for significant stenosis. Degree of narrowing less than 50% by ultrasound criteria bilaterally. Patent antegrade vertebral flow bilaterally Electronically Signed   By: Jerilynn Mages.  Shick M.D.   On: 11/29/2020 14:51   NCV with EMG(electromyography)  Result Date: 12/03/2020 Alda Berthold, DO     12/03/2020  4:02 PM [facility] [place], [place]  [place], [place] [address] Tel: [phone] Fax:  [phone] Test Date:  12/03/2020 Patient: Allison [name] DOB: [birth year] Physician: Narda Amber, DO Sex: Female Height: 5\' 2"  Ref Phys: Hulan Saas, DO ID#: [id number]   Technician:  Patient Complaints: This is a  60 year old female referred for evaluation of bilateral hand and wrist pain. NCV & EMG Findings: Extensive electrodiagnostic testing of the right upper extremity and additional studies of the left shows: Bilateral median, ulnar, and mixed palmar sensory responses are within normal limits. Bilateral median and ulnar motor responses are within normal limits. There is no evidence of active or chronic motor axonal loss changes affecting any of the tested muscles.  Motor unit configuration and recruitment pattern is within normal limits. Impression: This is a normal study of the upper extremities.  In particular, there is no evidence of carpal tunnel syndrome, ulnar neuropathy, or a cervical radiculopathy. ___________________________ Narda Amber, DO Nerve Conduction Studies Anti Sensory Summary Table  Stim Site NR Peak (ms) Norm Peak (ms) P-T Amp (V) Norm P-T Amp Left Median Anti Sensory (2nd Digit)  35C Wrist    2.6 <3.8 30.2 >10 Right Median Anti Sensory (2nd Digit)  35C Wrist    2.6 <3.8 26.3 >10 Left Ulnar Anti Sensory (5th Digit)  35C Wrist    2.3 <3.2 27.7 >5 Right Ulnar Anti Sensory (5th Digit)  35C Wrist    2.1 <3.2 29.3 >5 Motor Summary Table  Stim Site NR Onset (ms) Norm Onset (ms) O-P Amp (mV) Norm O-P Amp Site1 Site2 Delta-0 (ms) Dist (cm) Vel (m/s) Norm Vel (m/s) Left Median Motor (Abd Poll Brev)  35C Wrist    2.7 <4.0 8.7 >5 Elbow Wrist 4.4 27.0 61 >50 Elbow    7.1  8.7        Right Median Motor (Abd Poll Brev)  35C Wrist    3.0 <4.0 8.6 >5 Elbow Wrist 5.0 28.0 56 >50 Elbow    8.0  8.2        Left Ulnar Motor (Abd Dig Minimi)  35C Wrist    1.8 <3.1 9.4 >7 B Elbow Wrist 3.1 20.0 65 >50 B Elbow    4.9  8.7  A Elbow B Elbow 1.7 10.0 59 >50 A Elbow    6.6  8.7        Right Ulnar Motor (Abd Dig Minimi)  35C Wrist    2.0 <3.1 8.8 >7 B Elbow Wrist 3.2 20.0 63 >50 B Elbow    5.2  7.0  A Elbow B Elbow 1.3 9.0 69 >50 A Elbow    6.5  6.9        Comparison Summary Table  Stim Site NR Peak (ms) Norm Peak (ms)  P-T Amp (V) Site1 Site2 Delta-P (ms) Norm Delta (ms) Left Median/Ulnar Palm Comparison (Wrist - 8cm)  35C Median Palm    1.6 <2.2 71.9 Median Palm Ulnar Palm 0.3  Ulnar Palm  1.3 <2.2 18.8     Right Median/Ulnar Palm Comparison (Wrist - 8cm)  35C Median Palm    1.6 <2.2 71.8 Median Palm Ulnar Palm 0.3  Ulnar Palm    1.3 <2.2 25.7     EMG  Side Muscle Ins Act Fibs Psw Fasc Number Recrt Dur Dur. Amp Amp. Poly Poly. Comment Left 1stDorInt Nml Nml Nml Nml Nml Nml Nml Nml Nml Nml Nml Nml N/A Left PronatorTeres Nml Nml Nml Nml Nml Nml Nml Nml Nml Nml Nml Nml N/A Left Biceps Nml Nml Nml Nml Nml Nml Nml Nml Nml Nml Nml Nml N/A Left Triceps Nml Nml Nml Nml Nml Nml Nml Nml Nml Nml Nml Nml N/A Left Deltoid Nml Nml Nml Nml Nml Nml Nml Nml Nml Nml Nml Nml N/A Right PronatorTeres Nml Nml Nml Nml Nml Nml Nml Nml Nml Nml Nml Nml N/A Right Biceps Nml Nml Nml Nml Nml Nml Nml Nml Nml Nml Nml Nml N/A Right Triceps Nml Nml Nml Nml Nml Nml Nml Nml Nml Nml Nml Nml N/A Right Deltoid Nml Nml Nml Nml Nml Nml Nml Nml Nml Nml Nml Nml N/A Right 1stDorInt Nml Nml Nml Nml Nml Nml Nml Nml Nml Nml Nml Nml N/A Waveforms:             XR Hand 2 View Left  Result Date: 12/05/2020 X-ray left hand 2 views Radiocarpal joint space appears normal.  Small calcification or ossicle proximal to triquetrum.  MCP joint spaces appear normal.  PIP and DIP joint spaces appear intact early lateral osteophytes at second and third DIPs.  No joint erosions are seen. Impression Early osteoarthritis of DIPs no erosions or inflammatory arthritis changes are seen  XR Hand 2 View Right  Result Date: 12/05/2020 X-ray right hand 2 views Radiocarpal and carpal joint spaces appear normal.  MCP joint spaces appear normal.  Early joint space narrowing with reactive sclerosis and lateral osteophytes at second and third DIPs and first IP.  Bone mineralization suggestive of possible osteopenia. Impression Early osteoarthritis of distal finger joints, no erosive disease,  question early osteopenia   Recent Labs: Lab Results  Component Value Date   WBC 10.6 (H) 10/24/2020   HGB 14.6 10/24/2020   PLT 295.0 10/24/2020   NA 142 10/24/2020   K 4.0 10/24/2020   CL 108 10/24/2020   CO2 24 10/24/2020   GLUCOSE 76 10/24/2020   BUN 20 10/24/2020   CREATININE 0.84 10/24/2020   BILITOT 0.2 10/24/2020   ALKPHOS 89 10/24/2020   AST 15 10/24/2020   ALT 20 10/24/2020   PROT 7.5 10/24/2020   ALBUMIN 4.3 10/24/2020   CALCIUM 10.1 10/24/2020   CALCIUM 9.8 10/24/2020    Speciality Comments: No specialty comments available.  Procedures:  No procedures performed Allergies: Ezetimibe, Latex, Penicillin g, and Sulfa antibiotics   Assessment / Plan:     Visit Diagnoses: Rheumatoid arthritis with rheumatoid factor of multiple sites without organ or systems involvement ([place]) - Plan: XR Hand 2 View Right, XR Hand 2 View Left, Sedimentation rate  Findings appear consistent with seropositive rheumatoid arthritis with active inflammatory changes in at least 1 joint on exam today.  We will check bilateral hand x-rays for any erosive changes or demineralization.  I am not sure to what extent the current foot symptoms are the same process.  Checking sedimentation rate.  If not erosive change would consider initial treatment with hydroxychloroquine.  Iron deficiency  Previous lab work-up with iron deficiency though blood count was  okay she is taking oral iron supplementation tolerating this okay.  Plantar fasciitis, bilateral  Bilateral foot pain is consistent with plantar fasciitis.  I discussed this can be independent of rheumatoid arthritis or related although it is not a common initial site of disease activity.  We will definitely plan to treat her disease also provided exercise instructions recommended additional footwear support.  Positive ANA (antinuclear antibody) - Plan: Anti-DNA antibody, double-stranded, Anti-Mcferran antibody  I have low suspicion for ANA related  connective tissue disease with no specific symptoms on exam and history today.  We will check double-stranded DNA and [place] antibodies.  High risk medication use - Plan: Hepatitis B core antibody, IgM, Hepatitis B surface antigen, Hepatitis C antibody, QuantiFERON-TB Gold Plus  Anticipate starting rheumatoid arthritis treatment possible hydroxychloroquine versus methotrexate we will check baseline laboratory testing at this time hepatitis panel and QuantiFERON.   Orders: Orders Placed This Encounter  Procedures   XR Hand 2 View Right   XR Hand 2 View Left   Anti-DNA antibody, double-stranded   Anti-Gamero antibody   Sedimentation rate   Hepatitis B core antibody, IgM   Hepatitis B surface antigen   Hepatitis C antibody   QuantiFERON-TB Gold Plus    No orders of the defined types were placed in this encounter.   Follow-Up Instructions: No follow-ups on file.   Collier Salina, MD  Note - This record has been created using [employer].  Chart creation errors have been sought, but may not always  have been located. Such creation errors do not reflect on  the standard of medical care.

## 2020-12-04 NOTE — Progress Notes (Signed)
[place] [address] [place] Phone: [phone] Subjective:   I Kandace Blitz am serving as a Education administrator for Dr. Hulan Saas.  This visit occurred during the SARS-CoV-2 public health emergency.  Safety protocols were in place, including screening questions prior to the visit, additional usage of staff PPE, and extensive cleaning of exam room while observing appropriate contact time as indicated for disinfecting solutions.   I'm seeing this patient by the request  of:  Hodnett, Jacobo Forest, FNP  CC: Wrist pain and polyarthralgia follow-up  HCW:[id number]  10/24/2020 Bilateral.  Discussed potential carpal tunnel, awaiting new nerve conduction test.  Has also had cervical radiculopathy in her past and has had epidurals in there previously.  Patient is already on gabapentin.  Will do compression.  Worsening pain and if nerve conduction shows more carpal tunnel will do injection at follow-up  Patient has many different aches and pains.  Has had an elevated rheumatoid factor previously and I would like to recheck labs when further labs to see if anything else is potentially abnormal.  We discussed the potential for the possibility of an injection in the carpal tunnel which patient at this moment would rather go with a nerve conduction test.  I think this is reasonable.  Patient's ultrasound did not show significant enlargement of the median nerves bilaterally but did have some potential scarring right greater than left.  We did discuss the potential for changing medications such as the Cymbalta and increasing this and decreasing her sertraline.  At the moment we will hold and try more of the counter natural supplementations and see how patient will respond.  Patient continue to be active otherwise.  Discussed different compression sleeves and home exercises and follow-up again in 4 to 8 weeks  Update 12/05/2020 Keshawn Turvey is a 60 y.o. female coming in with  complaint of B wrist and neck pain. Patient states her hands still hurt. Neck is doing ok.  Patient was sent for nerve conduction study.  Nerve conduction study was unremarkable.  This was independently visualized by me today.  Laboratory work-up also showed the patient did have a positive ANA but a fairly low titer.  Positive CCP was greater than 250.  Significantly elevated rheumatoid factor at 268  Iron was significantly low  TSH low at 0.19  Vitamin D low at 28.5  Patient on cervical x-rays showed some calcific changes of the carotid arteries and ultrasounds were ordered.  Ultrasounds of the carotid showed less than 50% narrowing of the carotids bilaterally.  Past Medical History:  Diagnosis Date   Anxiety    Depression    GERD (gastroesophageal reflux disease)    diet controlled , tums occasional   Hemorrhoids    Hyperlipidemia    Kidney stones    Thyroid disease    Past Surgical History:  Procedure Laterality Date   APPENDECTOMY     arm surgery Right    nerve moved   BACK SURGERY     x 2 - lumbar - replaced 2 disc   CHOLECYSTECTOMY     COLONOSCOPY  07/2014   hx polyps [place]   kidney stones     x 2 - removed by surgery   LAPAROTOMY     cyst removed from ovary   SHOULDER SURGERY Right    TUBAL LIGATION     WISDOM TOOTH EXTRACTION     Social History   Socioeconomic History   Marital status: Married  Spouse name: Not on file   Number of children: Not on file   Years of education: Not on file   Highest education level: Not on file  Occupational History   Not on file  Tobacco Use   Smoking status: Former    Packs/day: 0.50    Years: 40.00    Pack years: 20.00    Types: Cigarettes    Quit date: 2020    Years since quitting: 2.5   Smokeless tobacco: Never  Vaping Use   Vaping Use: Never used  Substance and Sexual Activity   Alcohol use: Yes    Comment: socially   Drug use: Never   Sexual activity: Not on file  Other Topics Concern   Not  on file  Social History Narrative   Not on file   Social Determinants of Health   Financial Resource Strain: Not on file  Food Insecurity: Not on file  Transportation Needs: Not on file  Physical Activity: Not on file  Stress: Not on file  Social Connections: Not on file   Allergies  Allergen Reactions   Ezetimibe Other (See Comments)   Latex Itching   Penicillin G Hives   Sulfa Antibiotics Hives   Family History  Problem Relation Age of Onset   Asthma Mother    Heart disease Father    Osteoarthritis Sister    Heart disease Brother    Thyroid disease Daughter    Colon cancer Neg Hx    Rectal cancer Neg Hx    Stomach cancer Neg Hx     Current Outpatient Medications (Endocrine & Metabolic):    levothyroxine (SYNTHROID) 175 MCG tablet, 1 tab(s)  Current Outpatient Medications (Cardiovascular):    atorvastatin (LIPITOR) 80 MG tablet, TAKE 1 TABLET BY MOUTH ONE TIME DAILY AT BEDTIME   Enalapril-hydroCHLOROthiazide 5-12.5 MG tablet, 1 tab(s)   Current Outpatient Medications (Analgesics):    aspirin 81 MG EC tablet, TAKE 1 TABLET BY MOUTH EVERY DAY  Current Outpatient Medications (Hematological):    Ferrous Sulfate (IRON PO), Take by mouth daily.  Current Outpatient Medications (Other):    ALPRAZolam (XANAX) 0.5 MG tablet, 1 tab(s)   AMBULATORY NON FORMULARY MEDICATION, Medication Name: Nitroglycerin 0.125% gel. Apply pea size amount to the rectum three times a day for 6-8 weeks   Ascorbic Acid (VITAMIN C PO), Take by mouth daily.   DULoxetine (CYMBALTA) 20 MG capsule, Take 20 mg by mouth daily.   FIBER PO, Take by mouth 2 (two) times daily.   gabapentin (NEURONTIN) 300 MG/6ML solution, 1 cap(s)   hydrocortisone (ANUSOL-HC) 25 MG suppository,    Multiple Vitamin (MULTIVITAMIN PO), Take by mouth daily.   sertraline (ZOLOFT) 100 MG tablet, 1 tab(s)   TART CHERRY PO, Take by mouth.   TURMERIC PO, Take by mouth daily.   VITAMIN D PO, Take by mouth daily.   Reviewed  prior external information including notes and imaging from  primary care provider As well as notes that were available from care everywhere and other healthcare systems.  Past medical history, social, surgical and family history all reviewed in electronic medical record.  No pertanent information unless stated regarding to the chief complaint.   Review of Systems:  No headache, visual changes, nausea, vomiting, diarrhea, constipation, dizziness, abdominal pain, skin rash, fevers, chills, night sweats, weight loss, swollen lymph nodes, chest pain, shortness of breath, mood changes. POSITIVE muscle aches, body aches, joint swelling  Objective  Blood pressure 110/70, pulse 89, height 5\' 3"  (1.6  m), weight 187 lb (84.8 kg), SpO2 97 %.   General: No apparent distress alert and oriented x3 mood and affect normal, dressed appropriately.  HEENT: Pupils equal, extraocular movements intact  Respiratory: Patient's speak in full sentences and does not appear short of breath  Cardiovascular: No lower extremity edema, non tender, no erythema  Gait normal with good balance and coordination.  MSK:   Patient does have very mild swelling of the MCP joints noted to the hands bilaterally. Patient is sitting comfortably in the chair.  No significant swelling noted of the extremities other than the MCP swelling as stated above.   Impression and Recommendations:     The above documentation has been reviewed and is accurate and complete Lyndal Pulley, DO

## 2020-12-05 ENCOUNTER — Ambulatory Visit: Payer: Self-pay

## 2020-12-05 ENCOUNTER — Ambulatory Visit (INDEPENDENT_AMBULATORY_CARE_PROVIDER_SITE_OTHER): Payer: 59 | Admitting: Family Medicine

## 2020-12-05 ENCOUNTER — Other Ambulatory Visit: Payer: Self-pay

## 2020-12-05 ENCOUNTER — Encounter: Payer: Self-pay | Admitting: Family Medicine

## 2020-12-05 ENCOUNTER — Encounter: Payer: Self-pay | Admitting: Internal Medicine

## 2020-12-05 ENCOUNTER — Ambulatory Visit (INDEPENDENT_AMBULATORY_CARE_PROVIDER_SITE_OTHER): Payer: 59 | Admitting: Internal Medicine

## 2020-12-05 VITALS — BP 103/68 | HR 111 | Ht 63.0 in | Wt 187.6 lb

## 2020-12-05 DIAGNOSIS — M0579 Rheumatoid arthritis with rheumatoid factor of multiple sites without organ or systems involvement: Secondary | ICD-10-CM

## 2020-12-05 DIAGNOSIS — M722 Plantar fascial fibromatosis: Secondary | ICD-10-CM | POA: Diagnosis not present

## 2020-12-05 DIAGNOSIS — M79642 Pain in left hand: Secondary | ICD-10-CM | POA: Diagnosis not present

## 2020-12-05 DIAGNOSIS — Z79899 Other long term (current) drug therapy: Secondary | ICD-10-CM | POA: Insufficient documentation

## 2020-12-05 DIAGNOSIS — E611 Iron deficiency: Secondary | ICD-10-CM | POA: Diagnosis not present

## 2020-12-05 DIAGNOSIS — M79641 Pain in right hand: Secondary | ICD-10-CM | POA: Diagnosis not present

## 2020-12-05 DIAGNOSIS — R768 Other specified abnormal immunological findings in serum: Secondary | ICD-10-CM

## 2020-12-05 NOTE — Assessment & Plan Note (Signed)
Laboratory work-up did show iron deficiency.  Patient will continue with the supplementation of this as well as a vitamin D.

## 2020-12-05 NOTE — Patient Instructions (Addendum)
Plantar Fasciitis Exercises    Toe Curls With Towel    1. Place a small towel on the floor. Using involved foot, curl towel toward you, using only your toes. Relax.    2. Repeat 10 times, 1-2 times per day.          Toe Extension    1. Sit with involved leg crossed over uninvolved leg. Grasp toes with one hand and bend the toes and ankle upwards as far as possible to stretch the arch and calf muscle. With the other hand, perform deep massage along the arch of your foot.    2. Hold 10 seconds. Repeat for 2-3 minutes. Repeat 2-4 sessions per day.           Standing Calf Stretch    1. Stand placing hands on wall for support. Place your feet pointing straight ahead, with the involved foot in back of the other. The back leg should have a straight knee and front leg a bent knee. Shift forward, keeping back leg heel on the ground, so that you feel a stretch in the calf muscle of the back leg.    2. Hold 45 seconds, 2-3 times. Repeat 4-6 times per day.            Towel Stretch    The towel stretch is effective at reducing morning pain if done before getting out of bed.    1. Sit with involved leg straight out in front of you. Place a towel around your foot and gently pull toward you, feeling a stretch in your calf muscle.    2. Hold 45 seconds, 2-3 times. Repeat 4-6 times per day.           Calf Stretch on a Step    1. Stand with uninvolved foot flat on a step. Place involved ball of foot on the edge of the step. Gently let heel lower on involved leg to feel a stretch in your calf.    2. Hold 45 seconds, 2-3 times. Repeat 4-6 times per day.           Ice Massage Arch Roll    1. With involved foot resting on a frozen can or water bottle, golf ball, or tennis ball, roll your foot back and forth over the object.    2. Repeat for 3-5 minutes, 2 times per day.

## 2020-12-05 NOTE — Assessment & Plan Note (Addendum)
Do believe with patient's laboratory work-up likely most of this is secondary to the rheumatoid arthritis.  Discussed with patient different treatment options but I do feel that possibly an immune modulator would be more beneficial.  Patient is going to follow-up with rheumatology and we will see how patient responds.  Told her at this point we are here if she needs anything else.

## 2020-12-05 NOTE — Patient Instructions (Signed)
Good to see you Oofos or [employer] recovery sandals [employer] orthotics "total support" Rheumatologist for other complaints See me again in 2 months just incase

## 2020-12-06 ENCOUNTER — Encounter: Payer: Self-pay | Admitting: Family Medicine

## 2020-12-09 LAB — ANTI-DNA ANTIBODY, DOUBLE-STRANDED: ds DNA Ab: <1 IU/mL

## 2020-12-09 LAB — ANTI-SMITH ANTIBODY: ENA SM Ab Ser-aCnc: <1 AI

## 2020-12-09 LAB — SEDIMENTATION RATE: Sed Rate: 6 mm/h (ref 0–30)

## 2020-12-09 LAB — QUANTIFERON-TB GOLD PLUS
Mitogen-NIL: 3.84 IU/mL
NIL: 0.35 IU/mL
QuantiFERON-TB Gold Plus: NEGATIVE
TB1-NIL: <0 IU/mL
TB2-NIL: <0 IU/mL

## 2020-12-09 LAB — HEPATITIS C ANTIBODY
Hepatitis C Ab: NONREACTIVE
SIGNAL TO CUT-OFF: 0.02 (ref <1.00)

## 2020-12-09 LAB — HEPATITIS B CORE ANTIBODY, IGM: Hep B C IgM: NONREACTIVE

## 2020-12-09 LAB — HEPATITIS B SURFACE ANTIGEN: Hepatitis B Surface Ag: NONREACTIVE

## 2020-12-09 MED ORDER — HYDROXYCHLOROQUINE SULFATE 200 MG PO TABS: 400.0000 mg | ORAL_TABLET | Freq: Every day | ORAL | 1 refills | Status: DC

## 2020-12-09 NOTE — Addendum Note (Signed)
Addended by: Collier Salina on: 12/09/2020 08:33 AM   Modules accepted: Orders

## 2020-12-09 NOTE — Progress Notes (Signed)
I spoke with Allison [name] about her lab results and she agrees with plan to start hydroxychloroquine for rheumatoid arthritis and I sent prescription to her pharmacy.  Please contact her to schedule follow-up in 8 weeks.

## 2021-02-04 ENCOUNTER — Other Ambulatory Visit: Payer: Self-pay | Admitting: Internal Medicine

## 2021-02-04 DIAGNOSIS — M0579 Rheumatoid arthritis with rheumatoid factor of multiple sites without organ or systems involvement: Secondary | ICD-10-CM

## 2021-02-07 ENCOUNTER — Ambulatory Visit: Payer: 59 | Admitting: Family Medicine

## 2021-02-07 ENCOUNTER — Encounter: Payer: Self-pay | Admitting: Internal Medicine

## 2021-02-07 ENCOUNTER — Ambulatory Visit (INDEPENDENT_AMBULATORY_CARE_PROVIDER_SITE_OTHER): Payer: 59 | Admitting: Internal Medicine

## 2021-02-07 ENCOUNTER — Other Ambulatory Visit: Payer: Self-pay

## 2021-02-07 VITALS — BP 121/71 | HR 87 | Ht 60.0 in | Wt 187.6 lb

## 2021-02-07 DIAGNOSIS — R768 Other specified abnormal immunological findings in serum: Secondary | ICD-10-CM

## 2021-02-07 DIAGNOSIS — M0579 Rheumatoid arthritis with rheumatoid factor of multiple sites without organ or systems involvement: Secondary | ICD-10-CM

## 2021-02-07 MED ORDER — HYDROXYCHLOROQUINE SULFATE 200 MG PO TABS: 400.0000 mg | ORAL_TABLET | Freq: Every day | ORAL | 1 refills | Status: DC

## 2021-02-07 NOTE — Progress Notes (Signed)
Office Visit Note  Patient: Allison [name]             Date of Birth: 05-Apr-1961           MRN: [id number]             PCP: Dicie Beam, FNP Referring: Dicie Beam, FNP Visit Date: 02/07/2021   Subjective:  Follow-up (Patient is taking PLQ 400 mg daily. Patient feels as if symptoms are greatly improved.)   History of Present Illness: Allison [name] is a 60 y.o. female here for follow up for seropositive RA after starting hydroxychloroquine 400 mg daily and also some plantar fasciitis.  She feels symptoms are substantially improved with decreased swelling stiffness and improve range of motion in her hands.  She has changed footwear with recommendations from Dr. Tamala Julian also having improvement in the plantar fasciitis.  She has not noticed any problems taking the medication.  She has not yet seen her scheduled ophthalmology exam she states she has an upcoming eye exam this winter.  Previous HPI 12/05/20 Allison [name] is a 60 y.o. female here for multiple joint pains including bilateral hands. She was also referred for NCS this did not demonstrate abnormalities consistent with her symptoms.  She has had joint pains to some degree for a while but particularly noticed increased pain and stiffness decreased range of motion and intermittent swelling in joints of several fingers during the past few months.  She is also noticed increase in bilateral pain on the bottoms of the feet that is worst first thing in the morning when weightbearing and improves walking throughout the day.   Labs reviewed 10/2020 ANA 1:40 speckled, cytoplasmic ACE 52 CBC unremarkable CMP wnl CRP wnl RF 268 CCP >250 TSH 0.19 Uric acid 3.9 Iron sat 7.8% PTH wnl   Review of Systems  Constitutional:  Negative for fatigue.  HENT:  Negative for mouth sores, mouth dryness and nose dryness.   Eyes:  Positive for dryness. Negative for pain, itching and visual disturbance.  Respiratory:  Positive for cough. Negative for  hemoptysis, shortness of breath and difficulty breathing.   Cardiovascular:  Negative for chest pain, palpitations and swelling in legs/feet.  Gastrointestinal:  Negative for abdominal pain, blood in stool, constipation and diarrhea.  Endocrine: Negative for increased urination.  Genitourinary:  Negative for painful urination.  Musculoskeletal:  Negative for joint pain, joint pain, joint swelling, myalgias, muscle weakness, morning stiffness, muscle tenderness and myalgias.  Skin:  Negative for color change, rash and redness.  Allergic/Immunologic: Negative for susceptible to infections.  Neurological:  Negative for dizziness, numbness, headaches, memory loss and weakness.  Hematological:  Negative for swollen glands.  Psychiatric/Behavioral:  Negative for confusion and sleep disturbance.    PMFS History:  Patient Active Problem List   Diagnosis Date Noted   Rheumatoid arthritis with rheumatoid factor of multiple sites without organ or systems involvement ([place]) 12/05/2020   Iron deficiency 12/05/2020   Plantar fasciitis, bilateral 12/05/2020   High risk medication use 12/05/2020   Polyarthralgia 10/24/2020   Wrist pain 10/24/2020    Past Medical History:  Diagnosis Date   Anxiety    Depression    GERD (gastroesophageal reflux disease)    diet controlled , tums occasional   Hemorrhoids    Hyperlipidemia    Kidney stones    Thyroid disease     Family History  Problem Relation Age of Onset   Asthma Mother    Heart disease Father    Osteoarthritis  Sister    Heart disease Brother    Thyroid disease Daughter    Colon cancer Neg Hx    Rectal cancer Neg Hx    Stomach cancer Neg Hx    Past Surgical History:  Procedure Laterality Date   APPENDECTOMY     arm surgery Right    nerve moved   BACK SURGERY     x 2 - lumbar - replaced 2 disc   CHOLECYSTECTOMY     COLONOSCOPY  07/2014   [place]x polyps [place], [place]   kidney stones     x 2 - removed by surgery   LAPAROTOMY      cyst removed from ovary   SHOULDER SURGERY Right    TUBAL LIGATION     WISDOM TOOTH EXTRACTION     Social History   Social History Narrative   Not on file   Immunization History  Administered Date(s) Administered   Moderna SARS-COV2 Booster Vaccination 04/07/2020, 09/06/2020   Moderna Sars-Covid-2 Vaccination 08/30/2019, 09/27/2019     Objective: Vital Signs: BP 121/71 (BP Location: Left Arm, Patient Position: Sitting, Cuff Size: Normal)   Pulse 87   Ht 5' (1.524 m)   Wt 187 lb 9.6 oz (85.1 kg)   BMI 36.64 kg/m    Physical Exam Constitutional:      Appearance: She is obese.  Musculoskeletal:     Right lower leg: No edema.     Left lower leg: No edema.  Skin:    General: Skin is warm and dry.     Findings: No rash.  Neurological:     Mental Status: She is alert.     Musculoskeletal Exam:  Shoulders full ROM no tenderness or swelling Elbows full ROM no tenderness or swelling Wrists full ROM no tenderness or swelling Fingers full ROM no tenderness or swelling, chronic joint enlargement around right second and third MCPs Knees full ROM no tenderness or swelling Ankles full ROM no tenderness or swelling   CDAI Exam: CDAI Score: 2  Patient Global: 10 mm; Provider Global: 10 mm Swollen: 0 ; Tender: 0  Joint Exam 02/07/2021   All documented joints were normal     Investigation: No additional findings.  Imaging: No results found.  Recent Labs: Lab Results  Component Value Date   WBC 10.6 (H) 10/24/2020   HGB 14.6 10/24/2020   PLT 295.0 10/24/2020   NA 142 10/24/2020   K 4.0 10/24/2020   CL 108 10/24/2020   CO2 24 10/24/2020   GLUCOSE 76 10/24/2020   BUN 20 10/24/2020   CREATININE 0.84 10/24/2020   BILITOT 0.2 10/24/2020   ALKPHOS 89 10/24/2020   AST 15 10/24/2020   ALT 20 10/24/2020   PROT 7.5 10/24/2020   ALBUMIN 4.3 10/24/2020   CALCIUM 10.1 10/24/2020   CALCIUM 9.8 10/24/2020   QFTBGOLDPLUS NEGATIVE 12/05/2020    Speciality Comments: No  specialty comments available.  Procedures:  No procedures performed Allergies: Ezetimibe, Latex, Penicillin g, and Sulfa antibiotics   Assessment / Plan:     Visit Diagnoses: Rheumatoid arthritis with rheumatoid factor of multiple sites without organ or systems involvement ([place]) - Plan: hydroxychloroquine (PLAQUENIL) 200 MG tablet  Seropositive RA now in remission or at least low disease activity.  We will continue hydroxychloroquine 400 mg p.o. daily.  She has upcoming eye exam within the next few months and I am not sure whether this office has appropriate ophthalmology for OCT or visual field testing recommended she should ask them about hydroxychloroquine  eye exam and send [place] the results.  If it is not appropriate will need separate referral to ophthalmology.  Positive ANA (antinuclear antibody)  No new development of interval extra-articular symptoms to suggest secondary process.   Orders: No orders of the defined types were placed in this encounter.  Meds ordered this encounter  Medications   hydroxychloroquine (PLAQUENIL) 200 MG tablet    Sig: Take 2 tablets (400 mg total) by mouth daily.    Dispense:  180 tablet    Refill:  1      Follow-Up Instructions: Return in about 6 months (around 08/07/2021) for RA on HCq f/u 61mos.   Collier Salina, MD  Note - This record has been created using [employer].  Chart creation errors have been sought, but may not always  have been located. Such creation errors do not reflect on  the standard of medical care.

## 2021-02-07 NOTE — Patient Instructions (Signed)
You will need to have an eye exam for long term use of hydroxychloroquine. You can ask your current office if they can do this for you or if a referral is needed.

## 2021-06-06 ENCOUNTER — Encounter: Payer: Self-pay | Admitting: Internal Medicine

## 2021-06-10 NOTE — Telephone Encounter (Signed)
Can we request endocrinology records for her? I think it's relevant since may be affecting the symptoms we are managing. Reynold Bowen I believe works with [employer].

## 2021-07-25 ENCOUNTER — Other Ambulatory Visit: Payer: Self-pay

## 2021-07-25 ENCOUNTER — Encounter: Payer: Self-pay | Admitting: Internal Medicine

## 2021-07-25 ENCOUNTER — Ambulatory Visit (INDEPENDENT_AMBULATORY_CARE_PROVIDER_SITE_OTHER): Payer: 59 | Admitting: Internal Medicine

## 2021-07-25 VITALS — BP 118/72 | HR 100 | Ht 60.0 in | Wt 181.2 lb

## 2021-07-25 DIAGNOSIS — E039 Hypothyroidism, unspecified: Secondary | ICD-10-CM

## 2021-07-25 DIAGNOSIS — D509 Iron deficiency anemia, unspecified: Secondary | ICD-10-CM | POA: Diagnosis not present

## 2021-07-25 DIAGNOSIS — E041 Nontoxic single thyroid nodule: Secondary | ICD-10-CM | POA: Diagnosis not present

## 2021-07-25 LAB — CBC
HCT: 43 % (ref 36.0–46.0)
Hemoglobin: 14.3 g/dL (ref 12.0–15.0)
MCHC: 33.3 g/dL (ref 30.0–36.0)
MCV: 82.5 fl (ref 78.0–100.0)
Platelets: 268 10*3/uL (ref 150.0–400.0)
RBC: 5.21 Mil/uL — ABNORMAL HIGH (ref 3.87–5.11)
RDW: 13.1 % (ref 11.5–15.5)
WBC: 9.5 10*3/uL (ref 4.0–10.5)

## 2021-07-25 LAB — IRON: Iron: 33 ug/dL — ABNORMAL LOW (ref 42–145)

## 2021-07-25 LAB — TSH: TSH: 0.02 u[IU]/mL — ABNORMAL LOW (ref 0.35–5.50)

## 2021-07-25 NOTE — Patient Instructions (Signed)

## 2021-07-25 NOTE — Progress Notes (Signed)
? ? ?Name: Allison [name]  ?MRN/ DOB: [id number], 01-14-1961    ?Age/ Sex: 61 y.o., female   ? ?PCP: Hodnett, Jacobo Forest, FNP   ?Reason for Endocrinology Evaluation: Hypothyroidism  ?   ?Date of Initial Endocrinology Evaluation: 07/25/2021   ? ? ?HPI: ?[name] is a 61 y.o. female with a past medical history of RA and Hypothyroidism. The patient presented for initial endocrinology clinic visit on 07/25/2021 for consultative assistance with her Hypothyroidism.  ? ?She has been diagnosed with hypothyroidism in her adult life . She has been on Levothyroxine  for years  ? ?She has noted local neck symptoms  with intermittent swelling. Has been diagnosed with right thyroid nodule in 2019 , was lost to follow-up after that, no history of FNAs ? ? ?In January 2023 she has been noted with a TSH of 0.109 u IU/mL, she was on levothyroxine 175 +88 mcg daily, this was reduced to levothyroxine 175 +25 mcg with repeat TSH 0.069 uIU/mL ? ?Levothyroxine 175 mcg , 1 tablet daily PLUS levothyroxine 25 mcg  ? ?She has noted palpations since increasing the dose  ?Denies diarrhea or loose diarrhea  ?Has noted anxiety  ?Denies hear intolerance  ?No biotin intake ? ? ?No Fh of thyroid disease  ?Transferred care from Dr. Forde Dandy ? ?HISTORY:  ?Past Medical History:  ?Past Medical History:  ?Diagnosis Date  ? Anxiety   ? Depression   ? GERD (gastroesophageal reflux disease)   ? diet controlled , tums occasional  ? Hemorrhoids   ? Hyperlipidemia   ? Kidney stones   ? Thyroid disease   ? ?Past Surgical History:  ?Past Surgical History:  ?Procedure Laterality Date  ? APPENDECTOMY    ? arm surgery Right   ? nerve moved  ? BACK SURGERY    ? x 2 - lumbar - replaced 2 disc  ? CHOLECYSTECTOMY    ? COLONOSCOPY  07/2014  ? [place]  ? kidney stones    ? x 2 - removed by surgery  ? LAPAROTOMY    ? cyst removed from ovary  ? SHOULDER SURGERY Right   ? TUBAL LIGATION    ? WISDOM TOOTH EXTRACTION    ?  ?Social History:  reports that she quit  smoking about 3 years ago. Her smoking use included cigarettes. She has a 20.00 pack-year smoking history. She has never used smokeless tobacco. She reports current alcohol use. She reports that she does not use drugs. ?Family History: family history includes Asthma in her mother; Heart disease in her brother and father; Osteoarthritis in her sister; Thyroid disease in her daughter. ? ? ?HOME MEDICATIONS: ?Allergies as of 07/25/2021   ? ?   Reactions  ? Ezetimibe Other (See Comments)  ? Latex Itching  ? Penicillin G Hives  ? Sulfa Antibiotics Hives  ? ?  ? ?  ?Medication List  ?  ? ?  ? Accurate as of July 25, 2021  1:32 PM. If you have any questions, ask your nurse or doctor.  ?  ?  ? ?  ? ?STOP taking these medications   ? ?AMBULATORY NON FORMULARY MEDICATION ?Stopped by: Dorita Sciara, MD ?  ? ?  ? ?TAKE these medications   ? ?ALPRAZolam 0.5 MG tablet ?Commonly known as: XANAX ?1 tab(s) ?  ?aspirin 81 MG EC tablet ?TAKE 1 TABLET BY MOUTH EVERY DAY ?  ?atorvastatin 80 MG tablet ?Commonly known as: LIPITOR ?TAKE 1 TABLET BY MOUTH  ONE TIME DAILY AT BEDTIME ?  ?DULoxetine 20 MG capsule ?Commonly known as: CYMBALTA ?Take 20 mg by mouth daily. ?  ?Enalapril-hydroCHLOROthiazide 5-12.5 MG tablet ?1 tab(s) ?  ?FIBER PO ?Take by mouth 2 (two) times daily. ?  ?gabapentin 300 MG/6ML solution ?Commonly known as: NEURONTIN ?1 cap(s) ?  ?hydrocortisone 25 MG suppository ?Commonly known as: ANUSOL-HC ?  ?hydroxychloroquine 200 MG tablet ?Commonly known as: PLAQUENIL ?Take 2 tablets (400 mg total) by mouth daily. ?  ?IRON PO ?Take by mouth daily. ?  ?levothyroxine 175 MCG tablet ?Commonly known as: SYNTHROID ?1 tab(s) ?  ?MULTIVITAMIN PO ?Take by mouth daily. ?  ?sertraline 100 MG tablet ?Commonly known as: ZOLOFT ?1 tab(s) ?  ?TART CHERRY PO ?Take by mouth. ?  ?TURMERIC PO ?Take by mouth daily. ?  ?VITAMIN C PO ?Take by mouth daily. ?  ?VITAMIN D PO ?Take by mouth daily. ?  ? ?  ?  ? ? ?REVIEW OF SYSTEMS: ?A comprehensive  ROS was conducted with the patient and is negative except as per HPI  ? ? ?OBJECTIVE:  ?VS: BP 118/72 (BP Location: Left Arm, Patient Position: Sitting, Cuff Size: Small)   Pulse 100   Ht 5' (1.524 m)   Wt 181 lb 3.2 oz (82.2 kg)   SpO2 98%   BMI 35.39 kg/m?   ? ?Wt Readings from Last 3 Encounters:  ?07/25/21 181 lb 3.2 oz (82.2 kg)  ?02/07/21 187 lb 9.6 oz (85.1 kg)  ?12/05/20 187 lb (84.8 kg)  ? ? ? ?EXAM: ?General: Pt appears well and is in NAD  ?Neck: General: Supple without adenopathy. ?Thyroid: Thyroid size normal. Right thyroid nodule approximately 2 cm appreciated. No thyroid bruit.  ?Lungs: Clear with good BS bilat with no rales, rhonchi, or wheezes  ?Heart: Auscultation: RRR.  ?Abdomen: Normoactive bowel sounds, soft, nontender, without masses or organomegaly palpable  ?Extremities:  ?BL LE: No pretibial edema normal ROM and strength.  ?Mental Status: Judgment, insight: Intact ?Orientation: Oriented to time, place, and person ?Mood and affect: No depression, anxiety, or agitation  ? ? ? ?DATA REVIEWED: ? ? Latest Reference Range & Units 07/25/21 13:42  ?TSH 0.35 - 5.50 uIU/mL 0.02 (L)  ? ? ? ? Latest Reference Range & Units 07/25/21 13:42  ?Iron 42 - 145 ug/dL 33 (L)  ? ? Latest Reference Range & Units 07/25/21 13:42  ?WBC 4.0 - 10.5 K/uL 9.5  ?RBC 3.87 - 5.11 Mil/uL 5.21 (H)  ?Hemoglobin 12.0 - 15.0 g/dL 14.3  ?HCT 36.0 - 46.0 % 43.0  ?MCV 78.0 - 100.0 fl 82.5  ?MCHC 30.0 - 36.0 g/dL 33.3  ?RDW 11.5 - 15.5 % 13.1  ?Platelets 150.0 - 400.0 K/uL 268.0  ?(H): Data is abnormally high ? ? ?05/29/2021 0.109  ?07/11/2021 0.069   ? ?ASSESSMENT/PLAN/RECOMMENDATIONS:  ? ?Hypothyroidism: ? ?-Patient with palpitations ?-Has been noted with right neck swelling ?-- Pt educated extensively on the correct way to take levothyroxine (first thing in the morning with water, 30 minutes before eating or taking other medications). ?- Pt encouraged to double dose the following day if she were to miss a dose given long  half-life of levothyroxine. ?-Discussed cardiovascular risk and increased bone resorption with low TSH ? ?Medications : ?Stop levothyroxine 25 ?Levothyroxine 175 mcg, 1 tablet Monday through Saturday (Skip Sundays) ? ?2. Right thyroid nodule  ? ?-We will proceed with thyroid ultrasound ? ? ? ?3. Anemia :  ? ?-She has been diagnosed with iron deficiency, I would  like our levels checked today ?- CBC normal, iron level improving  ?- No change  ? ? ? ?Follow-up in 4 months ?Labs in 8 weeks ? ?Signed electronically by: ?Abby Nena Jordan, MD ? ?[facility]  ?[facility] ?[address] ?[place], [place] [address] ?Phone: [phone] ?FAX: [phone] ? ? ?CC: ?Hodnett, Jacobo Forest, FNP ?[facility] ?[place] [address] ?Phone: [phone] ?Fax: [phone] ? ? ?Return to Endocrinology clinic as below: ?Future Appointments  ?Date Time Provider [place]  ?09/19/2021 10:40 AM Rice, Resa Miner, MD [facility] None  ?  ? ? ? ? ? ?

## 2021-08-08 ENCOUNTER — Ambulatory Visit: Payer: 59 | Admitting: Internal Medicine

## 2021-09-01 ENCOUNTER — Other Ambulatory Visit: Payer: Self-pay | Admitting: Internal Medicine

## 2021-09-01 DIAGNOSIS — M0579 Rheumatoid arthritis with rheumatoid factor of multiple sites without organ or systems involvement: Secondary | ICD-10-CM

## 2021-09-18 NOTE — Progress Notes (Signed)
Office Visit Note  Patient: Allison [name]             Date of Birth: Jun 13, 1960           MRN: [id number]             PCP: Dicie Beam, FNP Referring: Dicie Beam, FNP Visit Date: 09/19/2021   Subjective:   History of Present Illness: Allison [name] is a 61 y.o. female here for follow up  for seropositive RA after starting hydroxychloroquine 400 mg daily and also some plantar fasciitis. She is doing well overall today. Morning stiffness is minimal and no flare ups or very swollen joints. She has noticed some increased low back and left hip pain this gets worse after prolonged time walking. She occasionally has numbness or pain radiating down the leg or falling asleep but not all the time. She has not fallen or lost balance. She does not have increased hip pain lying on her side to sleep.  Previous HPI 02/07/2021 Allison [name] is a 61 y.o. female here for follow up for seropositive RA after starting hydroxychloroquine 400 mg daily and also some plantar fasciitis.  She feels symptoms are substantially improved with decreased swelling stiffness and improve range of motion in her hands.  She has changed footwear with recommendations from Dr. Tamala Julian also having improvement in the plantar fasciitis.  She has not noticed any problems taking the medication.  She has not yet seen her scheduled ophthalmology exam she states she has an upcoming eye exam this winter.   Previous HPI 12/05/20 Allison [name] is a 61 y.o. female here for multiple joint pains including bilateral hands. She was also referred for NCS this did not demonstrate abnormalities consistent with her symptoms.  She has had joint pains to some degree for a while but particularly noticed increased pain and stiffness decreased range of motion and intermittent swelling in joints of several fingers during the past few months.  She is also noticed increase in bilateral pain on the bottoms of the feet that is worst first thing in the morning when  weightbearing and improves walking throughout the day.   Labs reviewed 10/2020 ANA 1:40 speckled, cytoplasmic ACE 52 CBC unremarkable CMP wnl CRP wnl RF 268 CCP >250 TSH 0.19 Uric acid 3.9 Iron sat 7.8% PTH wnl    Review of Systems  Constitutional:  Negative for fatigue.  HENT:  Negative for mouth sores, mouth dryness and nose dryness.   Eyes:  Positive for dryness. Negative for pain and itching.  Respiratory:  Negative for shortness of breath and difficulty breathing.   Cardiovascular:  Negative for chest pain and palpitations.  Gastrointestinal:  Negative for blood in stool, constipation and diarrhea.  Endocrine: Negative for increased urination.  Genitourinary:  Negative for difficulty urinating.  Musculoskeletal:  Positive for joint pain and joint pain. Negative for joint swelling, myalgias, morning stiffness, muscle tenderness and myalgias.  Skin:  Negative for color change, rash and redness.  Allergic/Immunologic: Negative for susceptible to infections.  Neurological:  Positive for numbness. Negative for dizziness, headaches, memory loss and weakness.  Hematological:  Negative for bruising/bleeding tendency.  Psychiatric/Behavioral:  Negative for confusion.    PMFS History:  Patient Active Problem List   Diagnosis Date Noted   Pain in left hip 09/19/2021   Rheumatoid arthritis with rheumatoid factor of multiple sites without organ or systems involvement ([place]) 12/05/2020   Iron deficiency 12/05/2020   Plantar fasciitis, bilateral 12/05/2020   High risk medication  use 12/05/2020   Polyarthralgia 10/24/2020   Wrist pain 10/24/2020    Past Medical History:  Diagnosis Date   Anxiety    Depression    GERD (gastroesophageal reflux disease)    diet controlled , tums occasional   Hemorrhoids    Hyperlipidemia    Kidney stones    Thyroid disease     Family History  Problem Relation Age of Onset   Asthma Mother    Heart disease Father    Osteoarthritis Sister     Heart disease Brother    Thyroid disease Daughter    Colon cancer Neg Hx    Rectal cancer Neg Hx    Stomach cancer Neg Hx    Past Surgical History:  Procedure Laterality Date   APPENDECTOMY     arm surgery Right    nerve moved   BACK SURGERY     x 2 - lumbar - replaced 2 disc   CHOLECYSTECTOMY     COLONOSCOPY  07/2014   [place]ps [place]   kidney stones     x 2 - removed by surgery   LAPAROTOMY     cyst removed from ovary   SHOULDER SURGERY Right    [place] EXTRACTION     Social History   Social History Narrative   Not on file   Immunization History  Administered Date(s) Administered   Moderna SARS-COV2 Booster Vaccination 04/07/2020, 09/06/2020   Moderna Sars-Covid-2 Vaccination 08/30/2019, 09/27/2019     Objective: Vital Signs: BP 116/79 (BP Location: Left Arm, Patient Position: Sitting, Cuff Size: Normal)   Pulse 90   Ht '5\' 1"'$  (1.549 m)   Wt 183 lb 3.2 oz (83.1 kg)   BMI 34.62 kg/m    Physical Exam Constitutional:      Appearance: She is obese.  Cardiovascular:     Rate and Rhythm: Normal rate and regular rhythm.  Pulmonary:     Effort: Pulmonary effort is normal.     Breath sounds: Normal breath sounds.  Skin:    General: Skin is warm and dry.     Findings: No rash.  Neurological:     General: No focal deficit present.     Mental Status: She is alert.  Psychiatric:        Mood and Affect: Mood normal.     Musculoskeletal Exam:  Shoulders full ROM no tenderness or swelling Elbows full ROM no tenderness or swelling Wrists full ROM no tenderness or swelling Fingers full ROM no tenderness or swelling Left lateral hip tenderness to pressure, mild pain in posterior hip with internal rotation Knees full ROM no tenderness or swelling Ankles full ROM no tenderness or swelling   CDAI Exam: CDAI Score: 2  Patient Global: 20 mm; Provider Global: 0 mm Swollen: 0 ; Tender: 0  Joint Exam 09/19/2021   All documented  joints were normal     Investigation: No additional findings.  Imaging: No results found.  Recent Labs: Lab Results  Component Value Date   WBC 9.5 07/25/2021   HGB 14.3 07/25/2021   PLT 268.0 07/25/2021   NA 142 10/24/2020   K 4.0 10/24/2020   CL 108 10/24/2020   CO2 24 10/24/2020   GLUCOSE 76 10/24/2020   BUN 20 10/24/2020   CREATININE 0.84 10/24/2020   BILITOT 0.2 10/24/2020   ALKPHOS 89 10/24/2020   AST 15 10/24/2020   ALT 20 10/24/2020   PROT 7.5 10/24/2020   ALBUMIN 4.3  10/24/2020   CALCIUM 10.1 10/24/2020   CALCIUM 9.8 10/24/2020   QFTBGOLDPLUS NEGATIVE 12/05/2020    Speciality Comments: No specialty comments available.  Procedures:  No procedures performed Allergies: Ezetimibe, Latex, Penicillin g, and Sulfa antibiotics   Assessment / Plan:     Visit Diagnoses: Rheumatoid arthritis with rheumatoid factor of multiple sites without organ or systems involvement (HCC) Positive ANA (antinuclear antibody)  Symptoms appear to be either low disease activity or possibly remission.  Back pain with walking unlikely related to inflammatory arthritis problem.  We will plan to continue the hydroxychloroquine 400 mg daily.  High risk medication use   Hydroxychloroquine 400 mg p.o. daily. PLQ eye exam is scheduled for June 2023.  Pain in left hip  Hip and back pain appears more use related likely combination of degenerative joint disease and some muscular pains.  Getting some benefit with the gabapentin and Cymbalta and turmeric medications for this.  Orders: No orders of the defined types were placed in this encounter.  No orders of the defined types were placed in this encounter.    Follow-Up Instructions: Return in about 6 months (around 03/22/2022) for RA on HCQ f/u 73mo.   CCollier Salina MD  Note - This record has been created using [employer]  Chart creation errors have been sought, but may not always  have been located. Such creation errors  do not reflect on  the standard of medical care.

## 2021-09-19 ENCOUNTER — Encounter: Payer: Self-pay | Admitting: Internal Medicine

## 2021-09-19 ENCOUNTER — Ambulatory Visit
Admission: RE | Admit: 2021-09-19 | Discharge: 2021-09-19 | Disposition: A | Discharge status: Home / Self Care | Payer: 59 | Source: Ambulatory Visit | Attending: Internal Medicine | Admitting: Internal Medicine

## 2021-09-19 ENCOUNTER — Ambulatory Visit (INDEPENDENT_AMBULATORY_CARE_PROVIDER_SITE_OTHER): Payer: 59 | Admitting: Internal Medicine

## 2021-09-19 ENCOUNTER — Other Ambulatory Visit (INDEPENDENT_AMBULATORY_CARE_PROVIDER_SITE_OTHER): Payer: 59

## 2021-09-19 VITALS — BP 116/79 | HR 90 | Ht 61.0 in | Wt 183.2 lb

## 2021-09-19 DIAGNOSIS — Z79899 Other long term (current) drug therapy: Secondary | ICD-10-CM

## 2021-09-19 DIAGNOSIS — E041 Nontoxic single thyroid nodule: Secondary | ICD-10-CM

## 2021-09-19 DIAGNOSIS — M0579 Rheumatoid arthritis with rheumatoid factor of multiple sites without organ or systems involvement: Secondary | ICD-10-CM

## 2021-09-19 DIAGNOSIS — R768 Other specified abnormal immunological findings in serum: Secondary | ICD-10-CM

## 2021-09-19 DIAGNOSIS — E039 Hypothyroidism, unspecified: Secondary | ICD-10-CM | POA: Diagnosis not present

## 2021-09-19 DIAGNOSIS — M25552 Pain in left hip: Secondary | ICD-10-CM | POA: Diagnosis not present

## 2021-09-19 LAB — TSH: TSH: 0.36 u[IU]/mL (ref 0.35–5.50)

## 2021-09-22 ENCOUNTER — Telehealth: Payer: Self-pay | Admitting: Internal Medicine

## 2021-09-22 DIAGNOSIS — E041 Nontoxic single thyroid nodule: Secondary | ICD-10-CM

## 2021-09-22 NOTE — Telephone Encounter (Signed)
Discussed thyroid ultrasound results with the patient on 09/22/2021 at 9 AM ? ? ?Patient with stable right thyroid nodule 1.5 cm but has peripheral calcifications.  Nodule met criteria for FNA, given that she has no prior history of FNA in this nodule I have recommended proceeding with biopsy, which she is in agreement of ?

## 2021-10-02 ENCOUNTER — Other Ambulatory Visit (HOSPITAL_COMMUNITY)
Admission: RE | Admit: 2021-10-02 | Discharge: 2021-10-02 | Disposition: A | Discharge status: Home / Self Care | Payer: 59 | Source: Ambulatory Visit | Attending: Internal Medicine | Admitting: Internal Medicine

## 2021-10-02 ENCOUNTER — Ambulatory Visit
Admission: RE | Admit: 2021-10-02 | Discharge: 2021-10-02 | Disposition: A | Discharge status: Home / Self Care | Payer: 59 | Source: Ambulatory Visit | Attending: Internal Medicine | Admitting: Internal Medicine

## 2021-10-02 DIAGNOSIS — E041 Nontoxic single thyroid nodule: Secondary | ICD-10-CM

## 2021-10-06 LAB — CYTOLOGY - NON PAP

## 2021-10-08 ENCOUNTER — Encounter: Payer: Self-pay | Admitting: Internal Medicine

## 2021-10-08 DIAGNOSIS — E041 Nontoxic single thyroid nodule: Secondary | ICD-10-CM

## 2021-10-20 NOTE — Telephone Encounter (Signed)
Spoke to the patient on 10/20/2021 at 12:30 PM   She is in agreement with proceeding with another FNA of the right thyroid nodule   An order has been placed

## 2021-11-20 ENCOUNTER — Other Ambulatory Visit: Payer: 59

## 2021-11-28 ENCOUNTER — Ambulatory Visit (INDEPENDENT_AMBULATORY_CARE_PROVIDER_SITE_OTHER): Payer: 59 | Admitting: Internal Medicine

## 2021-11-28 ENCOUNTER — Encounter: Payer: Self-pay | Admitting: Internal Medicine

## 2021-11-28 VITALS — BP 110/72 | HR 82 | Ht 61.0 in | Wt 185.0 lb

## 2021-11-28 DIAGNOSIS — E039 Hypothyroidism, unspecified: Secondary | ICD-10-CM

## 2021-11-28 DIAGNOSIS — E041 Nontoxic single thyroid nodule: Secondary | ICD-10-CM | POA: Diagnosis not present

## 2021-11-28 LAB — TSH: TSH: 1.01 u[IU]/mL (ref 0.35–5.50)

## 2021-11-28 MED ORDER — LEVOTHYROXINE SODIUM 150 MCG PO TABS: 150.0000 ug | ORAL_TABLET | Freq: Every day | ORAL | 3 refills | Status: DC

## 2021-11-28 NOTE — Progress Notes (Signed)
Name: Allison [name]  MRN/ DOB: [id number], 15-Jan-1961    Age/ Sex: 61 y.o., female    PCP: Hodnett, Jacobo Forest, FNP   Reason for Endocrinology Evaluation: Hypothyroidism     Date of Initial Endocrinology Evaluation: 07/25/2021    HPI: [name] is a 61 y.o. female with a past medical history of RA and Hypothyroidism. The patient presented for initial endocrinology clinic visit on 07/25/2021 for consultative assistance with her Hypothyroidism.   She has been diagnosed with hypothyroidism in her adult life . She has been on Levothyroxine  for years   She has noted local neck symptoms  with intermittent swelling. Has been diagnosed with right thyroid nodule in 2019 , was lost to follow-up after that, no history of FNAs   In January 2023 she has been noted with a TSH of 0.109 u IU/mL, she was on levothyroxine 175 +88 mcg daily, this was reduced to levothyroxine 175 +25 mcg with repeat TSH 0.069 uIU/mL  We have reduced her levothyroxine dose to optimize TSH  Repeat ultrasound on May/12/2021 revealed a stable 1.5 right centimeter thyroid nodule but due to peripheral calcifications and FNA has been recommended.  Unfortunately biopsy had to be halted after 3 attempts due to the patient having sore throat.  No family history of thyroid disease   Transferred care from Dr. Forde Dandy  SUBJECTIVE:    Today (11/28/21): [name] is here for follow-up on hypothyroidism and right thyroid nodule.   She had an attempt at an FNA for she couldn't tolerate the procedure , she is scheduled for a repeat 8/3rd  Weight stable  She continues with palpitations  Has alternating heat and cold intolerance Denies diarrhea or loose diarrhea  No biotin intake   Levothyroxine 175 mcg, 1 tablet Monday through Saturday (Skip Sundays)   HISTORY:  Past Medical History:  Past Medical History:  Diagnosis Date   Anxiety    Depression    GERD (gastroesophageal reflux disease)    diet controlled , tums  occasional   Hemorrhoids    Hyperlipidemia    Kidney stones    Thyroid disease    Past Surgical History:  Past Surgical History:  Procedure Laterality Date   APPENDECTOMY     arm surgery Right    nerve moved   BACK SURGERY     x 2 - lumbar - replaced 2 disc   CHOLECYSTECTOMY     COLONOSCOPY  07/2014   [place]ps [place], [place]   kidney stones     x 2 - removed by surgery   LAPAROTOMY     cyst removed from ovary   SHOULDER SURGERY Right    TUBAL LIGATION     WISDOM TOOTH EXTRACTION      Social History:  reports that she has been smoking cigarettes. She has been exposed to tobacco smoke. She has never used smokeless tobacco. She reports current alcohol use. She reports that she does not use drugs. Family History: family history includes Asthma in her mother; Heart disease in her brother and father; Osteoarthritis in her sister; Thyroid disease in her daughter.   HOME MEDICATIONS: Allergies as of 11/28/2021       Reactions   Ezetimibe Other (See Comments)   Latex Itching   Penicillin G Hives   Sulfa Antibiotics Hives        Medication List        Accurate as of November 28, 2021  1:38 PM. If you have any questions,  ask your nurse or doctor.          ALPRAZolam 0.5 MG tablet Commonly known as: XANAX 1 tab(s)   aspirin EC 81 MG tablet TAKE 1 TABLET BY MOUTH EVERY DAY   atorvastatin 80 MG tablet Commonly known as: LIPITOR TAKE 1 TABLET BY MOUTH ONE TIME DAILY AT BEDTIME   DULoxetine 20 MG capsule Commonly known as: CYMBALTA Take 20 mg by mouth daily.   Enalapril-hydroCHLOROthiazide 5-12.5 MG tablet 1 tab(s)   FIBER PO Take by mouth 2 (two) times daily.   gabapentin 300 MG/6ML solution Commonly known as: NEURONTIN 1 cap(s)   hydroxychloroquine 200 MG tablet Commonly known as: PLAQUENIL TAKE 2 TABLETS BY MOUTH EVERY DAY   IRON PO Take by mouth daily.   levothyroxine 175 MCG tablet Commonly known as: SYNTHROID 1 tab(s)   MULTIVITAMIN PO Take  by mouth daily.   sertraline 100 MG tablet Commonly known as: ZOLOFT 1 tab(s)   TART CHERRY PO Take by mouth.   TURMERIC PO Take by mouth daily.   VITAMIN C PO Take by mouth daily.   VITAMIN D PO Take by mouth daily.          REVIEW OF SYSTEMS: A comprehensive ROS was conducted with the patient and is negative except as per HPI    OBJECTIVE:  VS: BP 110/72 (BP Location: Left Arm, Patient Position: Sitting, Cuff Size: Small)   Pulse 82   Ht '5\' 1"'$  (1.549 m)   Wt 185 lb (83.9 kg)   SpO2 98%   BMI 34.96 kg/m    Wt Readings from Last 3 Encounters:  11/28/21 185 lb (83.9 kg)  09/19/21 183 lb 3.2 oz (83.1 kg)  07/25/21 181 lb 3.2 oz (82.2 kg)     EXAM: General: Pt appears well and is in NAD  Neck: General: Supple without adenopathy. Thyroid: Thyroid size normal. Right thyroid nodule appreciated.   Lungs: Clear with good BS bilat with no rales, rhonchi, or wheezes  Heart: Auscultation: RRR.  Abdomen: Normoactive bowel sounds, soft, nontender, without masses or organomegaly palpable  Extremities:  BL LE: No pretibial edema normal ROM and strength.  Mental Status: Judgment, insight: Intact Orientation: Oriented to time, place, and person Mood and affect: No depression, anxiety, or agitation     DATA REVIEWED:   Latest Reference Range & Units 11/28/21 14:02  TSH 0.35 - 5.50 uIU/mL 1.01    05/29/2021 0.109  07/11/2021 0.069    Thyroid ultrasound 09/19/2021  Nodule # 1:   Location: Right; Inferior   Maximum size: 1.5, previously 1.7 cm; Other 2 dimensions: 1.3 x 1.0 cm   Composition: solid/almost completely solid (2)   Echogenicity: isoechoic (1)   Shape: not taller-than-wide (0)   Margins: ill-defined (0)   Echogenic foci: peripheral calcifications (2)   ACR TI-RADS total points: 5.   ACR TI-RADS risk category: TR4 (4-6 points).   ACR TI-RADS recommendations:   **Given size (>/= 1.5 cm) and appearance, fine needle aspiration of this  moderately suspicious nodule should be considered based on TI-RADS criteria. However, stability dating back to 07/12/2017 supports a benign process. Therefore continued follow-up could also be performed.   _________________________________________________________   Stable thyroid heterogeneity and mild enlargement. No hypervascularity. No regional adenopathy.   IMPRESSION: Stable 1.5 cm right inferior thyroid TR 4 nodule dating back to 2019. See above comment.    ASSESSMENT/PLAN/RECOMMENDATIONS:   Hypothyroidism:  -Patient with palpitations - She has been taking it appropriately  -TSH is normal,  patient needs a refill, will change levothyroxine from 175 6 days a week to 150 mcg daily 7 days a week  Medications :  Stop levothyroxine 175 mcg, 1 tablet Monday through Saturday (Skip Sundays) Start levothyroxine 150 mcg daily  2. Right thyroid nodule   - Right inferior 1.5 cm nodule  with calcifications, an attempt at FNA was unsuccessful as the pt did not tolerate the procedure.  - She is scheduled for a repeat in 12/18/2021. I have advised her to take 1 tylenol and 1 xanax before the procedure       Follow-up in 4 months Labs in 8 weeks  Signed electronically by: Mack Guise, MD  [facility]  [facility] [address] [place], [place] [address] Phone: [phone] FAX: [phone]   CC: Hodnett, Jacobo Forest, FNP [address] [place] [address] Phone: [phone] Fax: [phone]   Return to Endocrinology clinic as below: Future Appointments  Date Time Provider [place]  11/28/2021  1:40 PM Swayzie Choate, Melanie Crazier, MD [facility] None  12/18/2021  2:30 PM [facility] [place] [facility]  03/27/2022 10:40 AM Rice, Resa Miner, MD [facility] None

## 2021-11-29 LAB — T4: T4, Total: 7.7 ug/dL (ref 5.1–11.9)

## 2021-12-18 ENCOUNTER — Other Ambulatory Visit (HOSPITAL_COMMUNITY)
Admission: RE | Admit: 2021-12-18 | Discharge: 2021-12-18 | Disposition: A | Discharge status: Home / Self Care | Payer: 59 | Source: Ambulatory Visit | Attending: Internal Medicine | Admitting: Internal Medicine

## 2021-12-18 ENCOUNTER — Ambulatory Visit
Admission: RE | Admit: 2021-12-18 | Discharge: 2021-12-18 | Disposition: A | Discharge status: Home / Self Care | Payer: 59 | Source: Ambulatory Visit | Attending: Internal Medicine | Admitting: Internal Medicine

## 2021-12-18 DIAGNOSIS — E041 Nontoxic single thyroid nodule: Secondary | ICD-10-CM | POA: Insufficient documentation

## 2021-12-19 LAB — CYTOLOGY - NON PAP

## 2021-12-29 ENCOUNTER — Telehealth: Payer: Self-pay | Admitting: Internal Medicine

## 2021-12-29 NOTE — Telephone Encounter (Signed)
Discussed FNA results with the patient on 12/29/2021 at 10:30 AM, with cytology report consistent with insufficient cellularity ([facility] category I)   In reviewing her thyroid ultrasound from 2019, it appears that despite peripheral calcifications the size of the nodule has been decreasing in size at 1.5 x 1.0 x 1.3 cm (previously 1.6 x 1.7 x 1.6)  The patient has had 2 attempts and FNA, initial biopsy had to be halted after 3 attempts due to sore throat and patient discomfort    I have recommended either proceeding with surgical intervention versus short-term ultrasound  But despite peripheral calcifications the size of the nodule has been decreasing and it may be reasonable to continue to monitor at this time  [place], MD  [facility]ocrinology  [facility] [address] [place], [place] [address] Phone: [place]: [phone]

## 2022-02-06 ENCOUNTER — Other Ambulatory Visit (INDEPENDENT_AMBULATORY_CARE_PROVIDER_SITE_OTHER): Payer: 59

## 2022-02-06 DIAGNOSIS — E039 Hypothyroidism, unspecified: Secondary | ICD-10-CM

## 2022-02-06 LAB — TSH: TSH: 1.43 u[IU]/mL (ref 0.35–5.50)

## 2022-02-26 ENCOUNTER — Other Ambulatory Visit: Payer: Self-pay | Admitting: Internal Medicine

## 2022-02-26 DIAGNOSIS — M0579 Rheumatoid arthritis with rheumatoid factor of multiple sites without organ or systems involvement: Secondary | ICD-10-CM

## 2022-02-26 NOTE — Progress Notes (Signed)
Office Visit Note  Patient: Allison [name]             Date of Birth: 1960/11/23           MRN: [id number]             PCP: Dicie Beam, FNP Referring: Dicie Beam, FNP Visit Date: 03/06/2022   Subjective:  Follow-up (Lower back pain. )   History of Present Illness: Allison [name] is a 61 y.o. 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  Her inflammatory joint symptoms have been well improved she had one episode of increased pain in her hands he associated with adjustment to her home office desk and working computer configuration.  Besides this her main issue is pain at the low back.  This is most significant at the midline and left side occasional radiation down the left leg.  Symptoms are frequently provoked if she walks for long periods if she pushes past a early or minor amount of pain will have more symptoms that can take hours or a day to recover.  She gets relief when taking about 800 mg ibuprofen as needed for back pain exacerbations.  She had interval follow-up with her ophthalmology with no problems on hydroxychloroquine retinal toxicity screening.  Previous HPI 09/19/2021  Allison [name] is a 61 y.o. female here for follow up  for seropositive RA after starting hydroxychloroquine 400 mg daily and also some plantar fasciitis. She is doing well overall today. Morning stiffness is minimal and no flare ups or very swollen joints. She has noticed some increased low back and left hip pain this gets worse after prolonged time walking. She occasionally has numbness or pain radiating down the leg or falling asleep but not all the time. She has not fallen or lost balance. She does not have increased hip pain lying on her side to sleep.   Previous HPI 02/07/2021 Allison [name] is a 61 y.o. female here for follow up for seropositive RA after starting hydroxychloroquine 400 mg daily and also some plantar fasciitis.  She feels symptoms are substantially improved with  decreased swelling stiffness and improve range of motion in her hands.  She has changed footwear with recommendations from Dr. Tamala Julian also having improvement in the plantar fasciitis.  She has not noticed any problems taking the medication.  She has not yet seen her scheduled ophthalmology exam she states she has an upcoming eye exam this winter.   Previous HPI 12/05/20 Allison [name] is a 61 y.o. female here for multiple joint pains including bilateral hands. She was also referred for NCS this did not demonstrate abnormalities consistent with her symptoms.  She has had joint pains to some degree for a while but particularly noticed increased pain and stiffness decreased range of motion and intermittent swelling in joints of several fingers during the past few months.  She is also noticed increase in bilateral pain on the bottoms of the feet that is worst first thing in the morning when weightbearing and improves walking throughout the day.   Labs reviewed 10/2020 ANA 1:40 speckled, cytoplasmic ACE 52 CBC unremarkable CMP wnl CRP wnl RF 268 CCP >250 TSH 0.19 Uric acid 3.9 Iron sat 7.8% PTH wnl   Review of Systems  Constitutional:  Positive for fatigue.  HENT:  Negative for mouth sores and mouth dryness.   Eyes:  Negative for dryness.  Respiratory:  Negative for shortness of breath.   Cardiovascular:  Positive for  palpitations. Negative for chest pain.  Gastrointestinal:  Negative for blood in stool, constipation and diarrhea.  Endocrine: Negative for increased urination.  Genitourinary:  Positive for involuntary urination.  Musculoskeletal:  Positive for joint pain, joint pain, joint swelling, myalgias, muscle weakness, morning stiffness, muscle tenderness and myalgias. Negative for gait problem.  Skin:  Negative for color change, rash, hair loss and sensitivity to sunlight.  Allergic/Immunologic: Negative for susceptible to infections.  Neurological:  Negative for dizziness and headaches.   Hematological:  Negative for swollen glands.  Psychiatric/Behavioral:  Positive for depressed mood and sleep disturbance. The patient is nervous/anxious.     PMFS History:  Patient Active Problem List   Diagnosis Date Noted   Low back pain 03/06/2022   Pain in left hip 09/19/2021   Rheumatoid arthritis with rheumatoid factor of multiple sites without organ or systems involvement ([place]) 12/05/2020   Iron deficiency 12/05/2020   Plantar fasciitis, bilateral 12/05/2020   High risk medication use 12/05/2020   Polyarthralgia 10/24/2020   Wrist pain 10/24/2020    Past Medical History:  Diagnosis Date   Anxiety    Depression    GERD (gastroesophageal reflux disease)    diet controlled , tums occasional   Hemorrhoids    Hyperlipidemia    Kidney stones    Thyroid disease     Family History  Problem Relation Age of Onset   Asthma Mother    Heart disease Father    Osteoarthritis Sister    Heart disease Brother    Thyroid disease Daughter    Colon cancer Neg Hx    Rectal cancer Neg Hx    Stomach cancer Neg Hx    Past Surgical History:  Procedure Laterality Date   APPENDECTOMY     arm surgery Right    nerve moved   BACK SURGERY     x 2 - lumbar - replaced 2 disc   CHOLECYSTECTOMY     COLONOSCOPY  07/2014   hx polyps [place]   kidney stones     x 2 - removed by surgery   LAPAROTOMY     cyst removed from ovary   SHOULDER SURGERY Right    [place] EXTRACTION     Social History   Social History Narrative   Not on file   Immunization History  Administered Date(s) Administered   Moderna SARS-COV2 Booster Vaccination 04/07/2020, 09/06/2020   Moderna Sars-Covid-2 Vaccination 08/30/2019, 09/27/2019     Objective: Vital Signs: BP 113/75 (BP Location: Left Arm, Patient Position: Sitting, Cuff Size: Normal)   Pulse 76   Resp 14   Ht '5\' 1"'$  (1.549 m)   Wt 186 lb (84.4 kg)   BMI 35.14 kg/m    Physical Exam Constitutional:       Appearance: She is obese.  Cardiovascular:     Rate and Rhythm: Normal rate and regular rhythm.  Pulmonary:     Effort: Pulmonary effort is normal.     Breath sounds: Normal breath sounds.  Skin:    General: Skin is warm and dry.  Neurological:     Mental Status: She is alert.  Psychiatric:        Mood and Affect: Mood normal.      Musculoskeletal Exam:  Neck full ROM no tenderness Shoulders full ROM no tenderness or swelling Elbows full ROM no tenderness or swelling Wrists full ROM no tenderness or swelling Fingers full ROM no tenderness or swelling Midline and left paraspinal  muscle tenderness to pressure at the low back just above iliac crest does not extend significantly laterally No lateral hip tenderness to pressure no pain with hip internal and external rotation Knees full ROM no tenderness or swelling Ankles full ROM no tenderness or swelling   CDAI Exam: CDAI Score: 2  Patient Global: 10 mm; Provider Global: 10 mm Swollen: 0 ; Tender: 0  Joint Exam 03/06/2022   All documented joints were normal     Investigation: No additional findings.  Imaging: XR Lumbar Spine 2-3 Views  Result Date: 03/06/2022 X-ray lumbar spine 2 views AP and lateral Surgical hardware appears to be in good position at L5-S1 joint space.  There are anterior endplate osteophytes most prominent at L4-L5 without significant loss of disc space height.  No significant spondylolisthesis.  No significant visible lateral osteophyte processes or facet joint space narrowing seen.  SI joints are patent bilaterally without significant degenerative changes.  Mild arterial calcifications seen anterior to L4 and L5. Impression L5-S1 surgical hardware appears to be in good position there is no significant spondylolisthesis, mild L4-L5 degenerative changes   Recent Labs: Lab Results  Component Value Date   WBC 11.5 (H) 03/06/2022   HGB 14.1 03/06/2022   PLT 284 03/06/2022   NA 142 10/24/2020   K 4.0  10/24/2020   CL 108 10/24/2020   CO2 24 10/24/2020   GLUCOSE 76 10/24/2020   BUN 20 10/24/2020   CREATININE 0.84 10/24/2020   BILITOT 0.2 10/24/2020   ALKPHOS 89 10/24/2020   AST 15 10/24/2020   ALT 20 10/24/2020   PROT 7.5 10/24/2020   ALBUMIN 4.3 10/24/2020   CALCIUM 10.1 10/24/2020   CALCIUM 9.8 10/24/2020   QFTBGOLDPLUS NEGATIVE 12/05/2020    Speciality Comments: PLQ Eye Exam: 10/22/2021 WNL @ [facility] [place] Follow upin 9 months.   Procedures:  No procedures performed Allergies: Ezetimibe, Latex, Penicillin g, and Sulfa antibiotics   Assessment / Plan:     Visit Diagnoses: Rheumatoid arthritis with rheumatoid factor of multiple sites without organ or systems involvement ([place]) - Plan: Sedimentation rate, ibuprofen (ADVIL) 800 MG tablet  Peripheral arthritis appears well controlled no synovitis tenderness or range of motion limitations on exam.  Rechecking sedimentation rate for disease activity monitoring.  Plan to continue hydroxychloroquine 400 mg daily.  She is taking the ibuprofen 800 mg up to once daily as needed for through symptoms without any major side effects.  High risk medication use - Hydroxychloroquine 400 mg p.o. daily - Plan: CBC with Differential/Platelet, COMPLETE METABOLIC PANEL WITH GFR  Checking CBC and CMP for medication monitoring on maintenance hydroxychloroquine and intermittent high-dose ibuprofen use.  Recent eye exam normal.  Pain in left hip  Appears to be improved today no issues with range of motion tenderness or strength on exam today.  Chronic left-sided low back pain with left-sided sciatica - Plan: XR Lumbar Spine 2-3 Views, ibuprofen (ADVIL) 800 MG tablet, gabapentin (NEURONTIN) 300 MG capsule  Low back pain is most severe midline and slightly to the left no significant involvement of the hip and no abnormal range of motion on exam.  She had previous surgery many years ago not actively following up with physical therapy or  orthopedic surgery at this time.  Updated x-ray of the lumbar spine in clinic today everything appears to be well-positioned there is only mild degree of degenerative arthritis at the L4-L5 level above this.  Recommend she continue trying to maintain physical activity and some weightbearing  exercises as tolerated.  We will send new prescription for ibuprofen 800 mg tablets and take up to once daily as needed.  We will send new prescription for the gabapentin 300 mg twice daily helpful for pain and for sciatica which is occasional.  Orders: Orders Placed This Encounter  Procedures   XR Lumbar Spine 2-3 Views   Sedimentation rate   CBC with Differential/Platelet   COMPLETE METABOLIC PANEL WITH GFR   Meds ordered this encounter  Medications   ibuprofen (ADVIL) 800 MG tablet    Sig: Take 1 tablet (800 mg total) by mouth daily as needed.    Dispense:  30 tablet    Refill:  5   gabapentin (NEURONTIN) 300 MG capsule    Sig: Take 1 capsule (300 mg total) by mouth 2 (two) times daily.    Dispense:  180 capsule    Refill:  1     Follow-Up Instructions: No follow-ups on file.   Collier Salina, MD  Note - This record has been created using [employer].  Chart creation errors have been sought, but may not always  have been located. Such creation errors do not reflect on  the standard of medical care.

## 2022-02-26 NOTE — Telephone Encounter (Signed)
Next Visit: 03/06/2022  Last Visit: 09/19/2021  Labs: 07/25/2021 CBC RBC 5.21 CMP 10/24/2020 WNL  Eye exam: 10/22/2021 WNL   Current Dose per office note 09/19/2021: hydroxychloroquine 400 mg daily  [id number] arthritis with rheumatoid factor of multiple sites without organ or systems involvement   Last Fill: 09/01/2021  Patient due for labs but scheduled for follow up on 03/06/2022  Okay to refill Plaquenil?

## 2022-03-06 ENCOUNTER — Ambulatory Visit (INDEPENDENT_AMBULATORY_CARE_PROVIDER_SITE_OTHER): Payer: 59

## 2022-03-06 ENCOUNTER — Encounter: Payer: Self-pay | Admitting: Internal Medicine

## 2022-03-06 ENCOUNTER — Ambulatory Visit: Payer: 59 | Attending: Internal Medicine | Admitting: Internal Medicine

## 2022-03-06 VITALS — BP 113/75 | HR 76 | Resp 14 | Ht 61.0 in | Wt 186.0 lb

## 2022-03-06 DIAGNOSIS — M0579 Rheumatoid arthritis with rheumatoid factor of multiple sites without organ or systems involvement: Secondary | ICD-10-CM | POA: Diagnosis not present

## 2022-03-06 DIAGNOSIS — M5442 Lumbago with sciatica, left side: Secondary | ICD-10-CM

## 2022-03-06 DIAGNOSIS — M25552 Pain in left hip: Secondary | ICD-10-CM | POA: Diagnosis not present

## 2022-03-06 DIAGNOSIS — G8929 Other chronic pain: Secondary | ICD-10-CM

## 2022-03-06 DIAGNOSIS — Z79899 Other long term (current) drug therapy: Secondary | ICD-10-CM

## 2022-03-06 DIAGNOSIS — M545 Low back pain, unspecified: Secondary | ICD-10-CM | POA: Insufficient documentation

## 2022-03-06 LAB — CBC WITH DIFFERENTIAL/PLATELET
Absolute Monocytes: 736 cells/uL (ref 200–950)
Basophils Absolute: 115 cells/uL (ref 0–200)
Basophils Relative: 1 %
Eosinophils Absolute: 334 cells/uL (ref 15–500)
Eosinophils Relative: 2.9 %
HCT: 41.8 % (ref 35.0–45.0)
Hemoglobin: 14.1 g/dL (ref 11.7–15.5)
Lymphs Abs: 3761 cells/uL (ref 850–3900)
MCH: 28.3 pg (ref 27.0–33.0)
MCHC: 33.7 g/dL (ref 32.0–36.0)
MCV: 83.9 fL (ref 80.0–100.0)
MPV: 10.6 fL (ref 7.5–12.5)
Monocytes Relative: 6.4 %
Neutro Abs: 6555 cells/uL (ref 1500–7800)
Neutrophils Relative %: 57 %
Platelets: 284 10*3/uL (ref 140–400)
RBC: 4.98 10*6/uL (ref 3.80–5.10)
RDW: 12.1 % (ref 11.0–15.0)
Total Lymphocyte: 32.7 %
WBC: 11.5 10*3/uL — ABNORMAL HIGH (ref 3.8–10.8)

## 2022-03-06 LAB — COMPLETE METABOLIC PANEL WITH GFR
AG Ratio: 1.6 (calc) (ref 1.0–2.5)
ALT: 35 U/L — ABNORMAL HIGH (ref 6–29)
AST: 20 U/L (ref 10–35)
Albumin: 4.1 g/dL (ref 3.6–5.1)
Alkaline phosphatase (APISO): 85 U/L (ref 37–153)
BUN: 19 mg/dL (ref 7–25)
CO2: 25 mmol/L (ref 20–32)
Calcium: 9.2 mg/dL (ref 8.6–10.4)
Chloride: 107 mmol/L (ref 98–110)
Creat: 0.76 mg/dL (ref 0.50–1.05)
Globulin: 2.6 g/dL (calc) (ref 1.9–3.7)
Glucose, Bld: 99 mg/dL (ref 65–99)
Potassium: 4 mmol/L (ref 3.5–5.3)
Sodium: 140 mmol/L (ref 135–146)
Total Bilirubin: 0.5 mg/dL (ref 0.2–1.2)
Total Protein: 6.7 g/dL (ref 6.1–8.1)
eGFR: 88 mL/min/{1.73_m2} (ref >=60)

## 2022-03-06 LAB — SEDIMENTATION RATE: Sed Rate: 14 mm/h (ref 0–30)

## 2022-03-06 MED ORDER — GABAPENTIN 300 MG PO CAPS: 300.0000 mg | ORAL_CAPSULE | Freq: Two times a day (BID) | ORAL | 1 refills | Status: DC

## 2022-03-06 MED ORDER — IBUPROFEN 800 MG PO TABS: 800.0000 mg | ORAL_TABLET | Freq: Every day | ORAL | 5 refills | Status: DC | PRN

## 2022-03-13 NOTE — Progress Notes (Signed)
Lab results look fine for continuing hydroxychloroquine. Her white blood cell count is slightly above normal. Sedimentation rate is normal.  Xray of her low back shows some probable mild degenerative changes developing at L4-L5 level and L5-S1 hardware looks fine. I do not have any previous lumbar spine imaging that I can personally review for a comparison, but no acute/sudden type problem is there.

## 2022-03-27 ENCOUNTER — Ambulatory Visit: Payer: 59 | Admitting: Internal Medicine

## 2022-05-27 ENCOUNTER — Other Ambulatory Visit: Payer: Self-pay | Admitting: Internal Medicine

## 2022-05-27 DIAGNOSIS — M0579 Rheumatoid arthritis with rheumatoid factor of multiple sites without organ or systems involvement: Secondary | ICD-10-CM

## 2022-05-27 NOTE — Telephone Encounter (Signed)
Next Visit: 09/04/2022  Last Visit: 03/06/2022  Labs: 03/06/2022 Lab results look fine for continuing hydroxychloroquine. Her white blood cell count is slightly above normal. Sedimentation rate is normal.   Eye exam: 10/22/2021 WNL    Current Dose per office note 03/06/2022: hydroxychloroquine 400 mg daily   QQ:[id number] arthritis with rheumatoid factor of multiple sites without organ or systems involvement   Last Fill: 02/26/2022  Okay to refill Plaquenil?

## 2022-06-05 ENCOUNTER — Ambulatory Visit (INDEPENDENT_AMBULATORY_CARE_PROVIDER_SITE_OTHER): Payer: 59 | Admitting: Internal Medicine

## 2022-06-05 ENCOUNTER — Encounter: Payer: Self-pay | Admitting: Internal Medicine

## 2022-06-05 VITALS — BP 104/66 | HR 79 | Ht 61.0 in | Wt 183.0 lb

## 2022-06-05 DIAGNOSIS — E041 Nontoxic single thyroid nodule: Secondary | ICD-10-CM | POA: Diagnosis not present

## 2022-06-05 DIAGNOSIS — E039 Hypothyroidism, unspecified: Secondary | ICD-10-CM

## 2022-06-05 LAB — T4, FREE: Free T4: 0.98 ng/dL (ref 0.60–1.60)

## 2022-06-05 LAB — TSH: TSH: 1.73 u[IU]/mL (ref 0.35–5.50)

## 2022-06-05 MED ORDER — LEVOTHYROXINE SODIUM 150 MCG PO TABS
150.0000 ug | ORAL_TABLET | Freq: Every day | ORAL | 3 refills | Status: DC
Start: 2022-06-05 — End: 2022-12-11

## 2022-06-05 NOTE — Progress Notes (Signed)
Name: Allison [name]  MRN/ DOB: [id number], 01-Mar-1961    Age/ Sex: 62 y.o., female    PCP: Hodnett, Jacobo Forest, FNP   Reason for Endocrinology Evaluation: Hypothyroidism     Date of Initial Endocrinology Evaluation: 07/25/2021    HPI: Allison [name] is a 62 y.o. female with a past medical history of RA and Hypothyroidism. The patient presented for initial endocrinology clinic visit on 07/25/2021 for consultative assistance with her Hypothyroidism.   She has been diagnosed with hypothyroidism in her adult life . She has been on Levothyroxine  for years   She has noted local neck symptoms  with intermittent swelling. Has been diagnosed with right thyroid nodule in 2019 , was lost to follow-up after that, no history of FNAs   In January 2023 she has been noted with a TSH of 0.109 u IU/mL, she was on levothyroxine 175 +88 mcg daily, this was reduced to levothyroxine 175 +25 mcg with repeat TSH 0.069 uIU/mL  We have reduced her levothyroxine dose to optimize TSH  Repeat ultrasound on May/12/2021 revealed a stable 1.5 right centimeter thyroid nodule but due to peripheral calcifications and FNA has been recommended.  Unfortunately biopsy had to be halted after 3 attempts due to the patient having sore throat. Another attempt was made 12/2021 but the sample was acellular   No family history of thyroid disease   Transferred care from Dr. Forde Dandy  SUBJECTIVE:    Today (06/05/22): Allison [name] is here for follow-up on hypothyroidism and right thyroid nodule.   Pt has been noted with weight loss  She has been coughing for past 2 weeks, Z-pak helped temporarily  Denies local neck swelling  Palpitations have improved  Denies diarrhea or loose diarrhea  No biotin intake Going to [place] in [place],[id number]   Levothyroxine 150 mcg daily    HISTORY:  Past Medical History:  Past Medical History:  Diagnosis Date   Anxiety    Depression    GERD (gastroesophageal reflux disease)    diet controlled , tums  occasional   Hemorrhoids    Hyperlipidemia    Kidney stones    Thyroid disease    Past Surgical History:  Past Surgical History:  Procedure Laterality Date   APPENDECTOMY     arm surgery Right    nerve moved   BACK SURGERY     x 2 - lumbar - replaced 2 disc   CHOLECYSTECTOMY     COLONOSCOPY  07/2014   [place]x polyps [place], [place]   kidney stones     x 2 - removed by surgery   LAPAROTOMY     cyst removed from ovary   SHOULDER SURGERY Right    TUBAL LIGATION     WISDOM TOOTH EXTRACTION      Social History:  reports that she has been smoking cigarettes. She has a 40.00 pack-year smoking history. She has been exposed to tobacco smoke. She has never used smokeless tobacco. She reports current alcohol use. She reports that she does not use drugs. Family History: family history includes Asthma in her mother; Heart disease in her brother and father; Osteoarthritis in her sister; Thyroid disease in her daughter.   HOME MEDICATIONS: Allergies as of 06/05/2022       Reactions   Ezetimibe Other (See Comments)   Latex Itching   Penicillin G Hives   Sulfa Antibiotics Hives        Medication List        Accurate as of  June 05, 2022 12:55 PM. If you have any questions, ask your nurse or doctor.          ALPRAZolam 0.5 MG tablet Commonly known as: XANAX 1 tab(s)   aspirin EC 81 MG tablet TAKE 1 TABLET BY MOUTH EVERY DAY   atorvastatin 80 MG tablet Commonly known as: LIPITOR TAKE 1 TABLET BY MOUTH ONE TIME DAILY AT BEDTIME   cetirizine 10 MG tablet Commonly known as: ZYRTEC Take 10 mg by mouth as needed.   DULoxetine 20 MG capsule Commonly known as: CYMBALTA Take 20 mg by mouth daily.   Enalapril-hydroCHLOROthiazide 5-12.5 MG tablet 1 tab(s)   FIBER PO Take by mouth 2 (two) times daily.   gabapentin 300 MG capsule Commonly known as: NEURONTIN Take 1 capsule (300 mg total) by mouth 2 (two) times daily.   hydroxychloroquine 200 MG tablet Commonly known as:  PLAQUENIL TAKE 2 TABLETS BY MOUTH EVERY DAY   ibuprofen 800 MG tablet Commonly known as: ADVIL Take 1 tablet (800 mg total) by mouth daily as needed.   IRON PO Take by mouth daily.   levothyroxine 150 MCG tablet Commonly known as: SYNTHROID Take 1 tablet (150 mcg total) by mouth daily. What changed: how much to take   MULTIVITAMIN PO Take by mouth daily.   sertraline 100 MG tablet Commonly known as: ZOLOFT 1 tab(s)   TART CHERRY PO Take by mouth.   TURMERIC PO Take by mouth daily.   VITAMIN C PO Take by mouth daily.   VITAMIN D PO Take by mouth daily.          REVIEW OF SYSTEMS: A comprehensive ROS was conducted with the patient and is negative except as per HPI    OBJECTIVE:  VS: BP 104/66 (BP Location: Left Arm, Patient Position: Sitting, Cuff Size: Large)   Pulse 79   Ht '5\' 1"'$  (1.549 m)   Wt 183 lb (83 kg)   SpO2 93%   BMI 34.58 kg/m    Wt Readings from Last 3 Encounters:  06/05/22 183 lb (83 kg)  03/06/22 186 lb (84.4 kg)  11/28/21 185 lb (83.9 kg)     EXAM: General: Pt appears well and is in NAD  Neck: General: Supple without adenopathy. Thyroid: Thyroid size normal. Unable to appreciate any nodules   Lungs: Clear with good BS bilat   Heart: Auscultation: RRR.  Abdomen: Normoactive bowel sounds, soft, nontender, without masses or organomegaly palpable  Extremities:  BL LE: No pretibial edema normal ROM and strength.  Mental Status: Judgment, insight: Intact Orientation: Oriented to time, place, and person Mood and affect: No depression, anxiety, or agitation     DATA REVIEWED:  Latest Reference Range & Units 06/05/22 13:05  TSH 0.35 - 5.50 uIU/mL 1.73  T4,Free(Direct) 0.60 - 1.60 ng/dL 0.98    Thyroid ultrasound 09/19/2021  Nodule # 1:   Location: Right; Inferior   Maximum size: 1.5, previously 1.7 cm; Other 2 dimensions: 1.3 x 1.0 cm   Composition: solid/almost completely solid (2)   Echogenicity: isoechoic (1)   Shape:  not taller-than-wide (0)   Margins: ill-defined (0)   Echogenic foci: peripheral calcifications (2)   ACR TI-RADS total points: 5.   ACR TI-RADS risk category: TR4 (4-6 points).   ACR TI-RADS recommendations:   **Given size (>/= 1.5 cm) and appearance, fine needle aspiration of this moderately suspicious nodule should be considered based on TI-RADS criteria. However, stability dating back to 07/12/2017 supports a benign process. Therefore continued follow-up  could also be performed.   _________________________________________________________   Stable thyroid heterogeneity and mild enlargement. No hypervascularity. No regional adenopathy.   IMPRESSION: Stable 1.5 cm right inferior thyroid TR 4 nodule dating back to 2019. See above comment.    FNA 12/18/2021  Clinical History: Nodule #1: Right; Inferior, Maximum size: 1.5 cm,  previously 1.7 cm; Other 2 dimensions: 1.3 x 1.0 cm, solid/almost  completely solid, isoechoic, Echogenic foci: peripheral calcifications,  TI-RADS total points 5. Previously biopsied 10/02/21  Specimen Submitted:  A. THYROID, RT INFERIOR, FINE NEEDLE ASPIRATION:    FINAL MICROSCOPIC DIAGNOSIS:  - Nondiagnostic material   SPECIMEN ADEQUACY:  INSUFFICIENT FOR DIAGNOSIS. The specimen is processed and examined  microscopically, but found to be UNSATISFACTORY for evaluation.   DIAGNOSTIC COMMENTS:  The specimen is essentially acellular.   ASSESSMENT/PLAN/RECOMMENDATIONS:   Hypothyroidism:  -Pt is euthyroid  -TFTs normal -No change  Medications :  Continue levothyroxine 150 mcg daily  2. Right thyroid nodule   - Right inferior 1.5 cm nodule  with calcifications, an attempt at FNA was unsuccessful  in 09/2021 as the pt did not tolerate the procedure. S/P FNA in 12/2021 with insufficient cellularity  -We did discuss the high risk of malignancy with calcifications, we discussed either proceeding with thyroidectomy versus monitoring -She is going  to [place] in May and would not like to have any interventions before then, we will order ultrasound by her next visit   3. Cough :   - She was treated with Z-Pak and glucocorticoid injection with temporary help but continues with cough  - Pt to use Claritin daily for 2 weeks    Follow-up in 4 months Labs in 8 weeks  Signed electronically by: Mack Guise, MD  [facility]  [facility] [address] [place], [place] [address] Phone: [phone] FAX: [phone]   CC: Hodnett, Jacobo Forest, FNP [address] [place] [address] Phone: [phone] Fax: [phone]   Return to Endocrinology clinic as below: Future Appointments  Date Time Provider [place]  06/05/2022  1:00 PM Murrel Bertram, Melanie Crazier, MD [facility] None  09/04/2022 10:20 AM Benjamine Mola, Resa Miner, MD [facility] None

## 2022-06-05 NOTE — Patient Instructions (Signed)

## 2022-07-16 ENCOUNTER — Encounter: Payer: Self-pay | Admitting: Gastroenterology

## 2022-07-30 ENCOUNTER — Encounter: Payer: Self-pay | Admitting: Gastroenterology

## 2022-07-30 ENCOUNTER — Ambulatory Visit (AMBULATORY_SURGERY_CENTER): Payer: 59

## 2022-07-30 VITALS — Ht 61.0 in | Wt 188.0 lb

## 2022-07-30 DIAGNOSIS — Z86010 Personal history of colonic polyps: Secondary | ICD-10-CM

## 2022-07-30 MED ORDER — NA SULFATE-K SULFATE-MG SULF 17.5-3.13-1.6 GM/177ML PO SOLN: 1.0000 | Freq: Once | ORAL | 0 refills | Status: AC

## 2022-07-30 NOTE — Progress Notes (Signed)
No egg or soy allergy known to patient  No issues known to pt with past sedation with any surgeries or procedures Patient denies ever being told they had issues or difficulty with intubation  No FH of Malignant Hyperthermia Pt is not on diet pills Pt is not on home 02  Pt is not on blood thinners  Pt denies issues with constipation  No A fib or A flutter Have any cardiac testing pending--NO Pt instructed to use [email or web address] or GoodRx for a price reduction on prep   

## 2022-08-22 ENCOUNTER — Other Ambulatory Visit: Payer: Self-pay | Admitting: Internal Medicine

## 2022-08-22 DIAGNOSIS — M0579 Rheumatoid arthritis with rheumatoid factor of multiple sites without organ or systems involvement: Secondary | ICD-10-CM

## 2022-08-24 NOTE — Telephone Encounter (Signed)
Last Fill: 05/27/2022  Eye exam: 10/22/2021 WNL   Labs: 03/06/2022 Lab results look fine for continuing hydroxychloroquine. Her white blood cell count is slightly above normal. Sedimentation rate is normal.   Next Visit: 09/04/2022  Last Visit: 03/06/2022  [id number] arthritis with rheumatoid factor of multiple sites without organ or systems involvement   Current Dose per office note 03/06/2022: Plan to continue hydroxychloroquine 400 mg daily   Patient to update labs at the upcomming appointment.   Okay to refill Plaquenil?

## 2022-08-31 ENCOUNTER — Other Ambulatory Visit: Payer: Self-pay | Admitting: Internal Medicine

## 2022-08-31 DIAGNOSIS — M0579 Rheumatoid arthritis with rheumatoid factor of multiple sites without organ or systems involvement: Secondary | ICD-10-CM

## 2022-08-31 DIAGNOSIS — G8929 Other chronic pain: Secondary | ICD-10-CM

## 2022-08-31 NOTE — Telephone Encounter (Signed)
Last Fill: 03/06/2022  Labs: 03/06/2022 Lab results look fine for continuing hydroxychloroquine. Her white blood cell count is slightly above normal. Sedimentation rate is normal.   Next Visit: 09/04/2022  Last Visit: 03/06/2022  DX: Rheumatoid arthritis with rheumatoid factor of multiple sites without organ or systems involvement    Current Dose per office note 03/06/2022: ibuprofen 800 mg up to once daily as needed for through symptoms without any major side effects.   Patient to update labs at upcoming appointment.   Okay to refill Ibuprofen?

## 2022-09-01 ENCOUNTER — Ambulatory Visit (INDEPENDENT_AMBULATORY_CARE_PROVIDER_SITE_OTHER): Payer: 59 | Admitting: Emergency Medicine

## 2022-09-01 ENCOUNTER — Ambulatory Visit (INDEPENDENT_AMBULATORY_CARE_PROVIDER_SITE_OTHER): Payer: 59

## 2022-09-01 ENCOUNTER — Encounter: Payer: Self-pay | Admitting: Emergency Medicine

## 2022-09-01 VITALS — BP 134/80 | HR 86 | Temp 98.2°F | Ht 64.0 in | Wt 182.0 lb

## 2022-09-01 DIAGNOSIS — R053 Chronic cough: Secondary | ICD-10-CM

## 2022-09-01 MED ORDER — BENZONATATE 100 MG PO CAPS: 100.0000 mg | ORAL_CAPSULE | Freq: Four times a day (QID) | ORAL | 1 refills | Status: DC | PRN

## 2022-09-01 MED ORDER — VALSARTAN 80 MG PO TABS: 80.0000 mg | ORAL_TABLET | Freq: Every day | ORAL | 2 refills | Status: DC

## 2022-09-01 NOTE — Patient Instructions (Addendum)
We will perform a chest x-ray today. We will arrange for pulmonary function testing at your next office visit. Congratulations on stopping smoking.  Do everything possible to avoid going back. Please stop your enalapril-HCTZ temporarily.  We will try starting valsartan-HCTZ 80-12.5 mg once daily until next visit. Continue omeprazole 20 mg twice a day.  If you continue to have cough we may decide to increase this going forward.  Take this medication 1 hour around food. Agree with triamcinolone nasal spray, 2 sprays each nostril once daily. Take Zyrtec once daily We will give you a prescription for Tessalon pearls 100 mg.  You can use this up to every 6 hours if needed for cough suppression. Continue your over-the-counter cough suppression medications as needed Follow Dr. Delton Coombes next available with PFT on the same day so we can review status.

## 2022-09-01 NOTE — Addendum Note (Signed)
Addended by: Sarina Ser R on: 09/01/2022 10:14 AM   Modules accepted: Orders

## 2022-09-01 NOTE — Assessment & Plan Note (Addendum)
She has had bouts of intermittent cough that go back for years, most recent bout has been persistent since December 2023, may have been precipitated by URI (unclear).  She has hoarseness, some documented glottic and posterior pharyngeal irritation on recent laryngoscopy.  She has clinically improved some since she was empirically started on GERD therapy, started on nasal steroid.  Still has cough.  Suspect that her upper airway remains irritated.  ACE inhibitor is certainly a potential contributor and needs to be changed.  We may decide to ramp up her allergy regimen, GERD regimen to maximize suppression and protect her upper airway.  Cough suppression would be beneficial as well and I will give her Tessalon Perles in addition to the over-the-counter cough suppression medication that she has been using.  Needs an evaluation for lower airways disease.  I will check a chest x-ray now, she may merit a CT chest going forward.  Even if the chest x-ray is clear she would qualify for lung cancer screening CT and we can discuss this in the future.  I will check pulmonary function testing to look for any contribution of COPD.

## 2022-09-01 NOTE — Progress Notes (Signed)
Subjective:    Patient ID: Allison [name], female    DOB: 1960-08-20, 62 y.o.   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 She has a hx of chronic cough - has had bouts of intermittent cough, most recent bout began in December and has persisted. Cough is dry. Sometimes loses her voice. Can wake her up at night. Seems to be at its worst in the am when she gets up. Also when she sips water - is not getting choked. She doesn't feel any daily reflux. She was recently started on triamcinolone NS, omeprazole, amitryptline by ENT who noted that her posterior pharynx / glottis. She is on an ACE-I.  She has some occasional exertional SOB.    Review of Systems As per HPI  Past Medical History:  Diagnosis Date   Anemia    Anxiety    Depression    GERD (gastroesophageal reflux disease)    diet controlled , tums occasional   Hemorrhoids    Hyperlipidemia    Hypertension    Kidney stones    Rheumatoid arthritis    Thyroid disease      Family History  Problem Relation Age of Onset   Asthma Mother    Heart disease Father    Osteoarthritis Sister    Colon polyps Brother    Colon polyps Brother    Heart disease Brother    Thyroid disease Daughter    Colon cancer Neg Hx    Rectal cancer Neg Hx    Stomach cancer Neg Hx    Esophageal cancer Neg Hx      Social History   Socioeconomic History   Marital status: Married    Spouse name: Not on file   Number of children: Not on file   Years of education: Not on file   Highest education level: Not on file  Occupational History   Not on file  Tobacco Use   Smoking status: Some Days    Packs/day: 1.00    Years: 40.00    Additional pack years: 0.00    Total pack years: 40.00    Types: Cigarettes    Last attempt to quit: 2020    Years since quitting: 4.2    Passive  exposure: Past   Smokeless tobacco: Former    Quit date: 08/18/2022  Vaping Use   Vaping Use: Never used  Substance and Sexual Activity   Alcohol use: Yes    Comment: socially   Drug use: Never   Sexual activity: Not on file  Other Topics Concern   Not on file  Social History Narrative   Not on file   Social Determinants of Health   Financial Resource Strain: Not on file  Food Insecurity: Not on file  Transportation Needs: Not on file  Physical Activity: Not on file  Stress: Not on file  Social Connections: Not on file  Intimate Partner Violence: Not on file     Allergies  Allergen Reactions   Ezetimibe Other (See Comments)   Latex Itching   Penicillin G Hives   Sulfa Antibiotics Hives     Outpatient Medications Prior to Visit  Medication Sig Dispense Refill   ALPRAZolam (XANAX) 0.5 MG tablet 1 tab(s)     aspirin 81 MG EC tablet TAKE 1 TABLET BY MOUTH EVERY DAY     atorvastatin (LIPITOR) 80 MG tablet TAKE  1 TABLET BY MOUTH ONE TIME DAILY AT BEDTIME     cetirizine (ZYRTEC) 10 MG tablet Take 10 mg by mouth as needed.     DULoxetine (CYMBALTA) 20 MG capsule Take 20 mg by mouth daily.     Enalapril-hydroCHLOROthiazide 5-12.5 MG tablet 1 tab(s)     Ferrous Sulfate (IRON PO) Take by mouth daily.     FIBER PO Take by mouth 2 (two) times daily.     gabapentin (NEURONTIN) 300 MG capsule Take 1 capsule (300 mg total) by mouth 2 (two) times daily. 180 capsule 1   hydroxychloroquine (PLAQUENIL) 200 MG tablet TAKE 2 TABLETS BY MOUTH EVERY DAY 60 tablet 0   ibuprofen (ADVIL) 800 MG tablet TAKE 1 TABLET (800 MG TOTAL) BY MOUTH DAILY AS NEEDED. 30 tablet 5   levothyroxine (SYNTHROID) 150 MCG tablet Take 1 tablet (150 mcg total) by mouth daily. 90 tablet 3   Multiple Vitamin (MULTIVITAMIN PO) Take by mouth daily.     sertraline (ZOLOFT) 100 MG tablet 1 tab(s)     No facility-administered medications prior to visit.        Objective:   Physical Exam  Vitals:   09/01/22 0932   BP: 134/80  Pulse: 86  Temp: 98.2 F (36.8 C)  TempSrc: Oral  SpO2: 96%  Weight: 182 lb (82.6 kg)  Height:  (1.626 m)   Gen: Pleasant, well-nourished, in no distress,  normal affect  ENT: No lesions,  mouth clear, no nasal drainage but she does sound congested.  Somewhat hoarse voice  Neck: No JVD, mild expiratory stridor  Lungs: No use of accessory muscles, good air movement.  Some referred upper airway noise Cardiovascular: RRR, heart sounds normal, no murmur or gallops, no peripheral edema  Musculoskeletal: No deformities, no cyanosis or clubbing  Neuro: alert, awake, non focal  Skin: Warm, no lesions or rashes      Assessment & Plan:  Chronic cough She has had bouts of intermittent cough that go back for years, most recent bout has been persistent since December 2023, may have been precipitated by URI (unclear).  She has hoarseness, some documented glottic and posterior pharyngeal irritation on recent laryngoscopy.  She has clinically improved some since she was empirically started on GERD therapy, started on nasal steroid.  Still has cough.  Suspect that her upper airway remains irritated.  ACE inhibitor is certainly a potential contributor and needs to be changed.  We may decide to ramp up her allergy regimen, GERD regimen to maximize suppression and protect her upper airway.  Cough suppression would be beneficial as well and I will give her Tessalon Perles in addition to the over-the-counter cough suppression medication that she has been using.  Needs an evaluation for lower airways disease.  I will check a chest x-ray now, she may merit a CT chest going forward.  Even if the chest x-ray is clear she would qualify for lung cancer screening CT and we can discuss this in the future.  I will check pulmonary function testing to look for any contribution of COPD.   Levy Pupa, MD, PhD 09/01/2022, 10:11 AM [facility] [phone] or if no answer  before 7:00PM call [phone] For any issues after 7:00PM please call eLink [phone]

## 2022-09-02 ENCOUNTER — Encounter: Payer: 59 | Admitting: Gastroenterology

## 2022-09-02 NOTE — Progress Notes (Signed)
Office Visit Note  Patient: Allison [name]             Date of Birth: Sep 09, 1960           MRN: [id number]             PCP: Laverda Sorenson, FNP Referring: Laverda Sorenson, FNP Visit Date: 09/04/2022   Subjective:  No chief complaint on file.   History of Present Illness: Allison [name] is a 62 y.o. 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  Overall she been doing well from a joint standpoint though in the past few weeks has experienced increased pain and stiffness especially at her left second third MCP joints.  Sometimes swelling throughout the entire third digit.  This is varied in intensity from day-to-day usually when it is worse she gets some relief from the ibuprofen 800 mg as needed.  Also has problem with ongoing nonproductive cough for the past few months.  Has had previous episodes of prolonged chronic cough usually provoked by some type of illness she had URI symptoms back in December.  Just had recent chest x-ray after follow-up in pulmonology clinic and switching from lisinopril to valsartan.   Previous HPI 03/06/22 Allison [name] is a 62 y.o. 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  Her inflammatory joint symptoms have been well improved she had one episode of increased pain in her hands he associated with adjustment to her home office desk and working computer configuration.  Besides this her main issue is pain at the low back.  This is most significant at the midline and left side occasional radiation down the left leg.  Symptoms are frequently provoked if she walks for long periods if she pushes past a early or minor amount of pain will have more symptoms that can take hours or a day to recover.  She gets relief when taking about 800 mg ibuprofen as needed for back pain exacerbations.  She had interval follow-up with her ophthalmology with no problems on hydroxychloroquine retinal toxicity screening.    Previous HPI 09/19/2021  Allison [name] is a 62 y.o. female here for follow up  for seropositive RA after starting hydroxychloroquine 400 mg daily and also some plantar fasciitis. She is doing well overall today. Morning stiffness is minimal and no flare ups or very swollen joints. She has noticed some increased low back and left hip pain this gets worse after prolonged time walking. She occasionally has numbness or pain radiating down the leg or falling asleep but not all the time. She has not fallen or lost balance. She does not have increased hip pain lying on her side to sleep.   Previous HPI 02/07/2021 Allison [name] is a 62 y.o. female here for follow up for seropositive RA after starting hydroxychloroquine 400 mg daily and also some plantar fasciitis.  She feels symptoms are substantially improved with decreased swelling stiffness and improve range of motion in her hands.  She has changed footwear with recommendations from Dr. Katrinka Blazing also having improvement in the plantar fasciitis.  She has not noticed any problems taking the medication.  She has not yet seen her scheduled ophthalmology exam she states she has an upcoming eye exam this winter.   Previous HPI 12/05/20 Allison [name] is a 62 y.o. female here for multiple joint pains including bilateral hands. She was also referred for NCS this did not demonstrate abnormalities consistent with her symptoms.  She has had joint pains to some degree for a while but particularly noticed increased pain and stiffness decreased range of motion and intermittent swelling in joints of several fingers during the past few months.  She is also noticed increase in bilateral pain on the bottoms of the feet that is worst first thing in the morning when weightbearing and improves walking throughout the day.   Labs reviewed 10/2020 ANA 1:40 speckled, cytoplasmic ACE 52 CBC unremarkable CMP wnl CRP wnl RF 268 CCP >250 TSH 0.19 Uric acid 3.9 Iron sat 7.8% PTH wnl      Review of Systems  Constitutional:  Positive for fatigue.  HENT:  Positive for mouth dryness. Negative for mouth sores.   Eyes:  Negative for dryness.  Respiratory:  Negative for shortness of breath.   Cardiovascular:  Negative for chest pain and palpitations.  Gastrointestinal:  Negative for blood in stool, constipation and diarrhea.  Endocrine: Negative for increased urination.  Genitourinary:  Positive for involuntary urination.  Musculoskeletal:  Positive for joint pain, joint pain, joint swelling, muscle weakness, morning stiffness and muscle tenderness. Negative for gait problem, myalgias and myalgias.  Skin:  Negative for color change, rash, hair loss and sensitivity to sunlight.  Allergic/Immunologic: Negative for susceptible to infections.  Neurological:  Negative for dizziness and headaches.  Hematological:  Negative for swollen glands.  Psychiatric/Behavioral:  Positive for depressed mood. Negative for sleep disturbance. The patient is not nervous/anxious.     PMFS History:  Patient Active Problem List   Diagnosis Date Noted   Chronic cough 09/01/2022   Low back pain 03/06/2022   Rheumatoid arthritis with rheumatoid factor of multiple sites without organ or systems involvement 12/05/2020   Iron deficiency 12/05/2020   Plantar fasciitis, bilateral 12/05/2020   High risk medication use 12/05/2020   Polyarthralgia 10/24/2020   Wrist pain 10/24/2020    Past Medical History:  Diagnosis Date   Anemia    Anxiety    Depression    GERD (gastroesophageal reflux disease)    diet controlled , tums occasional   Hemorrhoids    Hyperlipidemia    Hypertension    Kidney stones    Rheumatoid arthritis    Thyroid disease     Family History  Problem Relation Age of Onset   Asthma Mother    Heart disease Father    Osteoarthritis Sister    Colon polyps Brother    Colon polyps Brother    Heart disease Brother    Thyroid disease Daughter    Colon cancer Neg Hx    Rectal  cancer Neg Hx    Stomach cancer Neg Hx    Esophageal cancer Neg Hx    Past Surgical History:  Procedure Laterality Date   APPENDECTOMY     arm surgery Right    nerve moved   BACK SURGERY     x 2 - lumbar - replaced 2 disc   CHOLECYSTECTOMY     COLONOSCOPY  07/2014   hx polyps [place]   kidney stones     x 2 - removed by surgery   LAPAROTOMY     cyst removed from ovary   SHOULDER SURGERY Right    TUBAL LIGATION     WISDOM TOOTH EXTRACTION     Social History   Social History Narrative   Not on file   Immunization History  Administered Date(s) Administered   Moderna SARS-COV2 Booster Vaccination 04/07/2020, 09/06/2020   Moderna Sars-Covid-2 Vaccination 08/30/2019, 09/27/2019  Objective: Vital Signs: BP 119/81 (BP Location: Left Arm, Patient Position: Sitting, Cuff Size: Normal)   Pulse 80   Resp 16   Ht  (1.6 m)   Wt 182 lb (82.6 kg)   BMI 32.24 kg/m    Physical Exam Constitutional:      Appearance: She is obese.  Cardiovascular:     Rate and Rhythm: Normal rate and regular rhythm.  Pulmonary:     Effort: Pulmonary effort is normal.     Breath sounds: Normal breath sounds.  Musculoskeletal:     Right lower leg: No edema.     Left lower leg: No edema.  Lymphadenopathy:     Cervical: No cervical adenopathy.  Skin:    General: Skin is warm and dry.     Findings: No rash.  Neurological:     Mental Status: She is alert.  Psychiatric:        Mood and Affect: Mood normal.      Musculoskeletal Exam:  Shoulders full ROM no tenderness or swelling Elbows full ROM no tenderness or swelling Wrists full ROM no tenderness or swelling Fingers left 2nd-3rd MCP tenderness palpable swelling in 3rd, good grip ROM Knees full ROM no tenderness or swelling   CDAI Exam: CDAI Score: -- Patient Global: --; Provider Global: -- Swollen: 1 ; Tender: 2  Joint Exam 09/04/2022      Right  Left  MCP 2      Tender  MCP 3     Swollen Tender      Investigation: No additional findings.  Imaging: No results found.  Recent Labs: Lab Results  Component Value Date   WBC 11.5 (H) 03/06/2022   HGB 14.1 03/06/2022   PLT 284 03/06/2022   NA 140 03/06/2022   K 4.0 03/06/2022   CL 107 03/06/2022   CO2 25 03/06/2022   GLUCOSE 99 03/06/2022   BUN 19 03/06/2022   CREATININE 0.76 03/06/2022   BILITOT 0.5 03/06/2022   ALKPHOS 89 10/24/2020   AST 20 03/06/2022   ALT 35 (H) 03/06/2022   PROT 6.7 03/06/2022   ALBUMIN 4.3 10/24/2020   CALCIUM 9.2 03/06/2022   QFTBGOLDPLUS NEGATIVE 12/05/2020    Speciality Comments: PLQ Eye Exam: 10/22/2021 WNL @ [facility] Follow upin 9 months.   Procedures:  No procedures performed Allergies: Ezetimibe, Latex, Penicillin g, and Sulfa antibiotics   Assessment / Plan:     Visit Diagnoses: Rheumatoid arthritis with rheumatoid factor of multiple sites without organ or systems involvement - Plan: Sedimentation rate, C-reactive protein, hydroxychloroquine (PLAQUENIL) 200 MG tablet  Joint inflammation appears mostly under control but 1 objectively swollen joint and describes pain and several others.  Checking sed rate and CRP for disease activity monitoring.  If serum inflammatory markers are very high may become indication needing escalation of treatment.  If normal but with the increase in her reported symptoms with prescribed short course prednisone taper.  Otherwise continue hydroxychloroquine 400 mg daily.  High risk medication use - Plan: CBC with Differential/Platelet, COMPLETE METABOLIC PANEL WITH GFR  Checking CBC and CMP for medication monitoring on long-term hydroxychloroquine and high-dose ibuprofen.  Eye exam from last year was normal.  She had very persistent coughing for months and medications were adjusted but not rheumatology specific.  Will be interested if she gets lung CT screening or follow-up for chest x-ray to also rule out RA pulmonary  involvement.  Chronic left-sided low back pain with left-sided sciatica - Plan: gabapentin (  NEURONTIN) 300 MG capsule  Continue gabapentin 300 mg for back pain with sciatica.   Orders: Orders Placed This Encounter  Procedures   Sedimentation rate   C-reactive protein   CBC with Differential/Platelet   COMPLETE METABOLIC PANEL WITH GFR   Meds ordered this encounter  Medications   gabapentin (NEURONTIN) 300 MG capsule    Sig: Take 1 capsule (300 mg total) by mouth 2 (two) times daily.    Dispense:  180 capsule    Refill:  1   hydroxychloroquine (PLAQUENIL) 200 MG tablet    Sig: Take 2 tablets (400 mg total) by mouth daily.    Dispense:  180 tablet    Refill:  1     Follow-Up Instructions: Return in about 6 months (around 03/06/2023) for RA on HCQ f/u 6mos.   Fuller Plan, MD  Note - This record has been created using [employer].  Chart creation errors have been sought, but may not always  have been located. Such creation errors do not reflect on  the standard of medical care.

## 2022-09-04 ENCOUNTER — Ambulatory Visit: Payer: 59 | Attending: Internal Medicine | Admitting: Internal Medicine

## 2022-09-04 ENCOUNTER — Encounter: Payer: Self-pay | Admitting: Internal Medicine

## 2022-09-04 VITALS — BP 119/81 | HR 80 | Resp 16 | Ht 63.0 in | Wt 182.0 lb

## 2022-09-04 DIAGNOSIS — G8929 Other chronic pain: Secondary | ICD-10-CM | POA: Diagnosis not present

## 2022-09-04 DIAGNOSIS — Z79899 Other long term (current) drug therapy: Secondary | ICD-10-CM

## 2022-09-04 DIAGNOSIS — M0579 Rheumatoid arthritis with rheumatoid factor of multiple sites without organ or systems involvement: Secondary | ICD-10-CM | POA: Diagnosis not present

## 2022-09-04 DIAGNOSIS — M5442 Lumbago with sciatica, left side: Secondary | ICD-10-CM

## 2022-09-04 MED ORDER — GABAPENTIN 300 MG PO CAPS: 300.0000 mg | ORAL_CAPSULE | Freq: Two times a day (BID) | ORAL | 1 refills | Status: AC

## 2022-09-04 MED ORDER — HYDROXYCHLOROQUINE SULFATE 200 MG PO TABS: 400.0000 mg | ORAL_TABLET | Freq: Every day | ORAL | 1 refills | Status: DC

## 2022-09-05 LAB — COMPLETE METABOLIC PANEL WITH GFR
AG Ratio: 1.6 (calc) (ref 1.0–2.5)
ALT: 21 U/L (ref 6–29)
AST: 19 U/L (ref 10–35)
Albumin: 4.2 g/dL (ref 3.6–5.1)
Alkaline phosphatase (APISO): 92 U/L (ref 37–153)
BUN: 22 mg/dL (ref 7–25)
CO2: 21 mmol/L (ref 20–32)
Calcium: 9.6 mg/dL (ref 8.6–10.4)
Chloride: 109 mmol/L (ref 98–110)
Creat: 0.92 mg/dL (ref 0.50–1.05)
Globulin: 2.7 g/dL (calc) (ref 1.9–3.7)
Glucose, Bld: 95 mg/dL (ref 65–99)
Potassium: 4 mmol/L (ref 3.5–5.3)
Sodium: 141 mmol/L (ref 135–146)
Total Bilirubin: 0.4 mg/dL (ref 0.2–1.2)
Total Protein: 6.9 g/dL (ref 6.1–8.1)
eGFR: 70 mL/min/{1.73_m2} (ref >=60)

## 2022-09-05 LAB — CBC WITH DIFFERENTIAL/PLATELET
Absolute Monocytes: 541 cells/uL (ref 200–950)
Basophils Absolute: 90 cells/uL (ref 0–200)
Basophils Relative: 1.1 %
Eosinophils Absolute: 279 cells/uL (ref 15–500)
Eosinophils Relative: 3.4 %
HCT: 43.1 % (ref 35.0–45.0)
Hemoglobin: 14.5 g/dL (ref 11.7–15.5)
Lymphs Abs: 2591 cells/uL (ref 850–3900)
MCH: 27.8 pg (ref 27.0–33.0)
MCHC: 33.6 g/dL (ref 32.0–36.0)
MCV: 82.7 fL (ref 80.0–100.0)
MPV: 10.3 fL (ref 7.5–12.5)
Monocytes Relative: 6.6 %
Neutro Abs: 4699 cells/uL (ref 1500–7800)
Neutrophils Relative %: 57.3 %
Platelets: 293 10*3/uL (ref 140–400)
RBC: 5.21 10*6/uL — ABNORMAL HIGH (ref 3.80–5.10)
RDW: 12.7 % (ref 11.0–15.0)
Total Lymphocyte: 31.6 %
WBC: 8.2 10*3/uL (ref 3.8–10.8)

## 2022-09-05 LAB — SEDIMENTATION RATE: Sed Rate: 11 mm/h (ref 0–30)

## 2022-09-05 LAB — C-REACTIVE PROTEIN: CRP: <3 mg/L (ref <8.0)

## 2022-09-07 MED ORDER — PREDNISONE 5 MG PO TABS: ORAL_TABLET | ORAL | 0 refills | Status: AC

## 2022-09-07 NOTE — Progress Notes (Signed)
Lab results look fine there is no increase in sedimentation rate or CRP to indicate a increased systemic inflammation.  Okay to continue the hydroxychloroquine 400 mg daily.  I am sending a 12-day prednisone taper she can take for the current increased symptoms.

## 2022-09-07 NOTE — Addendum Note (Signed)
Addended by: Fuller Plan on: 09/07/2022 01:00 AM   Modules accepted: Orders

## 2022-11-11 ENCOUNTER — Ambulatory Visit (INDEPENDENT_AMBULATORY_CARE_PROVIDER_SITE_OTHER): Payer: 59 | Admitting: Gastroenterology

## 2022-11-11 ENCOUNTER — Other Ambulatory Visit (INDEPENDENT_AMBULATORY_CARE_PROVIDER_SITE_OTHER): Payer: 59

## 2022-11-11 ENCOUNTER — Encounter: Payer: Self-pay | Admitting: Gastroenterology

## 2022-11-11 VITALS — BP 120/70 | HR 83 | Ht 63.0 in | Wt 185.0 lb

## 2022-11-11 DIAGNOSIS — E611 Iron deficiency: Secondary | ICD-10-CM | POA: Diagnosis not present

## 2022-11-11 DIAGNOSIS — Z86010 Personal history of colonic polyps: Secondary | ICD-10-CM

## 2022-11-11 DIAGNOSIS — R053 Chronic cough: Secondary | ICD-10-CM

## 2022-11-11 LAB — IBC + FERRITIN
Ferritin: 188.1 ng/mL (ref 10.0–291.0)
Iron: 40 ug/dL — ABNORMAL LOW (ref 42–145)
Saturation Ratios: 13.9 % — ABNORMAL LOW (ref 20.0–50.0)
TIBC: 288.4 ug/dL (ref 250.0–450.0)
Transferrin: 206 mg/dL — ABNORMAL LOW (ref 212.0–360.0)

## 2022-11-11 NOTE — Progress Notes (Signed)
HPI :  62 year old female with a history of rheumatoid arthritis, history of colon polyps, chronic cough, here to reestablish care to discuss surveillance colonoscopy as well as some other issues.  I have not seen her since March 2021.  I performed her last colonoscopy in March 2021.  She had a difficult to remove 1 cm ascending colon polyp in addition to some other small polyp.  Large polyp was sessile serrated.  Had recommended repeat exam in 3 years.  She denies any problems with her bowels.  No blood in the stool routinely but can have some blood on the toilet paper when wiping she thinks due to hemorrhoids.  She does not have any abdominal pains on a routine basis or problems that bother her.  No family history of colon cancer.  She has a history of rheumatoid arthritis on hydroxychloroquine.  She had a chronic cough for several months and was referred to Dr. Delton Coombes with pulmonary.  She states her voice got very raspy and it was affecting her vocal cord.  She did not really have any reflux symptoms at the time, was treated empirically with omeprazole 20 mg daily as well as Zyrtec and nasal steroid.  She states after about 3 weeks for chronic cough got better and she had been doing pretty well in that regard.  She has stopped the nasal steroid but continue Zyrtec every day as well as omeprazole.  She states historically she was having periodic epigastric pressure pain that would respond to drinking milk or having a cracker.  This was happening every 2 weeks for some time.  She states it really has not happened to often over the past year, perhaps has happened only twice.  She has not had any episodes since starting omeprazole.  She denies any dysphagia.  She has never had an EGD.  She does use periodic NSAIDs, rarely uses 800 mg of ibuprofen for headache, as well as Goody powder or Aleve.  She does not use these routinely.  She is scheduled to see Dr. Delton Coombes in follow-up on July 10, she is having PFTs  to make sure no evidence of COPD, she has a history of tobacco use, and to see him in follow-up.  Since I last saw her in 2021, she had lab work done in June 2022 showing iron deficiency.  She was placed on iron.  Her last iron studies were March 2023 and her iron level remained low.  She takes iron tablet routinely.  Her hemoglobin has remained in the 14s, never anemic.  Most recently CBC on April 19 shows a hemoglobin of 14.5 with MCV of 82.7.  Prior endoscopic history: Colonoscopy 08/04/2019: - Perianal skin tags found on perianal exam. - Small anal fissure - The examined portion of the ileum was normal. - One 10 mm polyp in the ascending colon, removed with a cold snare. Resected and retrieved. - One 6 mm polyp in the transverse colon, removed with a cold snare. Resected and retrieved. - Two 3 mm polyps in the rectum, removed with a cold snare. Resected and retrieved. - Diverticulosis in the sigmoid colon. - Internal hemorrhoids. - The examination was otherwise normal.  1. Surgical [P], colon, rectum, polyp (2) - HYPERPLASTIC POLYP (1 OF 1 FRAGMENTS) - NO HIGH GRADE DYSPLASIA OR MALIGNANCY IDENTIFIED 2. Surgical [P], colon, ascending, transverse, polyp (2) - MULTIPLE FRAGMENTS OF SESSILE SERRATED POLYP(S) - NO HIGH GRADE DYSPLASIA OR MALIGNANCY IDENTIFIED     Past Medical History:  Diagnosis Date   Anemia    Anxiety    Depression    GERD (gastroesophageal reflux disease)    diet controlled , tums occasional   Hemorrhoids    Hyperlipidemia    Hypertension    Kidney stones    Rheumatoid arthritis (HCC)    Thyroid disease      Past Surgical History:  Procedure Laterality Date   APPENDECTOMY     arm surgery Right    nerve moved   BACK SURGERY     x 2 - lumbar - replaced 2 disc   CHOLECYSTECTOMY     COLONOSCOPY  07/2014   hx polyps [place], [place]   kidney stones     x 2 - removed by surgery   LAPAROTOMY     cyst removed from ovary   SHOULDER SURGERY Right    TUBAL  LIGATION     WISDOM TOOTH EXTRACTION     Family History  Problem Relation Age of Onset   Asthma Mother    Heart disease Father    Osteoarthritis Sister    Colon polyps Brother    Colon polyps Brother    Heart disease Brother    Thyroid disease Daughter    Colon cancer Neg Hx    Rectal cancer Neg Hx    Stomach cancer Neg Hx    Esophageal cancer Neg Hx    Social History   Tobacco Use   Smoking status: Former    Packs/day: 1.00    Years: 40.00    Additional pack years: 0.00    Total pack years: 40.00    Types: Cigarettes    Quit date: 2020    Years since quitting: 4.4    Passive exposure: Past   Smokeless tobacco: Never  Vaping Use   Vaping Use: Former  Substance Use Topics   Alcohol use: Yes    Comment: socially   Drug use: Never   Current Outpatient Medications  Medication Sig Dispense Refill   ALPRAZolam (XANAX) 0.5 MG tablet 1 tab(s)     amitriptyline (ELAVIL) 10 MG tablet TAKE 1 TABLET BY MOUTH EVERY DAY AT BEDTIME FOR 30 DAYS     aspirin 81 MG EC tablet TAKE 1 TABLET BY MOUTH EVERY DAY     atorvastatin (LIPITOR) 80 MG tablet TAKE 1 TABLET BY MOUTH ONE TIME DAILY AT BEDTIME     cetirizine (ZYRTEC) 10 MG tablet Take 10 mg by mouth as needed.     DULoxetine (CYMBALTA) 20 MG capsule Take 20 mg by mouth daily.     Ferrous Sulfate (IRON PO) Take by mouth daily.     FIBER PO Take by mouth 2 (two) times daily.     gabapentin (NEURONTIN) 300 MG capsule Take 1 capsule (300 mg total) by mouth 2 (two) times daily. 180 capsule 1   hydroxychloroquine (PLAQUENIL) 200 MG tablet Take 2 tablets (400 mg total) by mouth daily. 180 tablet 1   ibuprofen (ADVIL) 800 MG tablet TAKE 1 TABLET (800 MG TOTAL) BY MOUTH DAILY AS NEEDED. 30 tablet 5   levothyroxine (SYNTHROID) 150 MCG tablet Take 1 tablet (150 mcg total) by mouth daily. 90 tablet 3   Multiple Vitamin (MULTIVITAMIN PO) Take by mouth daily.     Omeprazole 20 MG TBEC PLEASE SEE ATTACHED FOR DETAILED DIRECTIONS     sertraline  (ZOLOFT) 100 MG tablet 1 tab(s)     triamcinolone (NASACORT) 55 MCG/ACT AERO nasal inhaler SPRAY 2 SPRAYS EVERY DAY BY INTRANASAL ROUTE FOR  45 DAYS.     valsartan (DIOVAN) 80 MG tablet Take 1 tablet (80 mg total) by mouth daily. 30 tablet 2   No current facility-administered medications for this visit.   Allergies  Allergen Reactions   Ezetimibe Other (See Comments)   Latex Itching   Penicillin G Hives   Sulfa Antibiotics Hives     Review of Systems: All systems reviewed and negative except where noted in HPI.   Lab Results  Component Value Date   WBC 8.2 09/04/2022   HGB 14.5 09/04/2022   HCT 43.1 09/04/2022   MCV 82.7 09/04/2022   PLT 293 09/04/2022    Lab Results  Component Value Date   CREATININE 0.92 09/04/2022   BUN 22 09/04/2022   NA 141 09/04/2022   K 4.0 09/04/2022   CL 109 09/04/2022   CO2 21 09/04/2022    Lab Results  Component Value Date   ALT 21 09/04/2022   AST 19 09/04/2022   ALKPHOS 89 10/24/2020   BILITOT 0.4 09/04/2022   Lab Results  Component Value Date   IRON 33 (L) 07/25/2021     Physical Exam: BP 120/70   Pulse 83   Ht 5\' 3"  (1.6 m)   Wt 185 lb (83.9 kg)   SpO2 97%   BMI 32.77 kg/m  Constitutional: Pleasant,well-developed, female in no acute distress. HEENT: Normocephalic and atraumatic. Conjunctivae are normal. No scleral icterus. Neck supple.  Cardiovascular: Normal rate, regular rhythm.  Pulmonary/chest: Effort normal and breath sounds normal. No wheezing, rales or rhonchi. Abdominal: Soft, nondistended / protuberant, nontender. There are no masses palpable. No hepatomegaly. Extremities: no edema Lymphadenopathy: No cervical adenopathy noted. Neurological: Alert and oriented to person place and time. Skin: Skin is warm and dry. No rashes noted. Psychiatric: Normal mood and affect. Behavior is normal.   ASSESSMENT: 62 y.o. female here for assessment of the following  1. History of colon polyps   2. Chronic cough   3.  Iron deficiency    History of 1 cm sessile serrated polyp removed from the right colon in March 2021.  She is due for surveillance colonoscopy for history of polyps.  She has no bowel symptoms that bother her.  Otherwise, review of her chart she had an iron deficiency for the past few years that has not been full evaluated. She is not having any menses. She has had chronic cough for months and was treated for both reflux and postnasal drip and her cough has definitely improved, however unclear true cause of this.  She does not have any reflux or baseline upper tract symptoms that bother her.  She does have occasional NSAID use but does not use routinely, in regards to her IDA.  She did have occasional epigastric pain in recent years, however that has not really bothered her much lately.  Given her iron deficiency over time, I think an EGD is reasonable to clear her upper tract at the same time as we perform her surveillance colonoscopy.  I discussed risks and benefits of both of these procedures and that of anesthesia, and she wants to proceed.  I would like her to have follow-up with pulmonary get her PFTs done on July 10 to make sure okay, and if that looks okay we will plan for procedures later in July or early August.  If the PFTs show something concerning that would prohibit her from having anesthesia we can always postpone her procedures if needed.  She is agreeable after full discussion of  these issues. In the interim, I recommend she minimize her NSAID use which she has been doing, continue iron, continue omeprazole at this time until we can do her endoscopy.  I will recheck her iron studies today to make sure she is having an appropriate response to oral iron.  Further recommendations pending results of her test and her course.     PLAN: - follow up with pulmonary for PFTs on July 10th - schedule EGD and colon[facility] at the [facility], to be done once she has had follow up with pulmonary / PFTs -  minimize NSAID use - continue omeprazole for now - lab today - TIBC / ferritin panel - checking response to oral iron  Harlin Rain, MD [facility]  CC: Hodnett, Bubba Camp, FNP

## 2022-11-11 NOTE — Patient Instructions (Addendum)
_______________________________________________________  If your blood pressure at your visit was 140/90 or greater, please contact your primary care physician to follow up on this.  _______________________________________________________  If you are age 64 or older, your body mass index should be between 23-30. Your Body mass index is 32.77 kg/m. If this is out of the aforementioned range listed, please consider follow up with your Primary Care Provider.  If you are age 83 or younger, your body mass index should be between 19-25. Your Body mass index is 32.77 kg/m. If this is out of the aformentioned range listed, please consider follow up with your Primary Care Provider.   ________________________________________________________  The [facility] providers would like to encourage you to use [facility] to communicate with providers for non-urgent requests or questions.  Due to long hold times on the telephone, sending your provider a message by [facility] may be a faster and more efficient way to get a response.  Please allow 48 business hours for a response.  Please remember that this is for non-urgent requests.  _______________________________________________________   Your provider has requested that you go to the basement level for lab work before leaving today. Press "B" on the elevator. The lab is located at the first door on the left as you exit the elevator.   You have been scheduled for an endoscopy and colonoscopy. Please follow the written instructions given to you at your visit today. Please pick up your prep supplies at the pharmacy within the next 1-3 days. If you use inhalers (even only as needed), please bring them with you on the day of your procedure.  Continue your Omeprazole.  Minimize NSAID use.  Thank you for entrusting me with your care and for choosing [facility], Dr. Ileene Patrick

## 2022-11-25 ENCOUNTER — Encounter: Payer: Self-pay | Admitting: Emergency Medicine

## 2022-11-25 ENCOUNTER — Ambulatory Visit (INDEPENDENT_AMBULATORY_CARE_PROVIDER_SITE_OTHER): Payer: 59 | Admitting: Emergency Medicine

## 2022-11-25 ENCOUNTER — Ambulatory Visit: Payer: 59 | Admitting: Emergency Medicine

## 2022-11-25 VITALS — BP 134/80 | HR 86 | Temp 98.9°F | Ht 63.0 in | Wt 185.0 lb

## 2022-11-25 DIAGNOSIS — Z720 Tobacco use: Secondary | ICD-10-CM | POA: Diagnosis not present

## 2022-11-25 DIAGNOSIS — R053 Chronic cough: Secondary | ICD-10-CM | POA: Diagnosis not present

## 2022-11-25 DIAGNOSIS — J449 Chronic obstructive pulmonary disease, unspecified: Secondary | ICD-10-CM | POA: Diagnosis not present

## 2022-11-25 LAB — PULMONARY FUNCTION TEST
DL/VA % pred: 106 %
DL/VA: 4.47 ml/min/mmHg/L
DLCO cor % pred: 90 %
DLCO cor: 17.36 ml/min/mmHg
DLCO unc % pred: 90 %
DLCO unc: 17.36 ml/min/mmHg
FEF 25-75 Post: 1.13 L/sec
FEF 25-75 Pre: 1.85 L/sec
FEF2575-%Change-Post: -38 %
FEF2575-%Pred-Post: 53 %
FEF2575-%Pred-Pre: 87 %
FEV1-%Change-Post: -17 %
FEV1-%Pred-Post: 70 %
FEV1-%Pred-Pre: 85 %
FEV1-Post: 1.65 L
FEV1-Pre: 2.01 L
FEV1FVC-%Change-Post: -15 %
FEV1FVC-%Pred-Pre: 98 %
FEV6-%Change-Post: -2 %
FEV6-%Pred-Post: 84 %
FEV6-%Pred-Pre: 86 %
FEV6-Post: 2.49 L
FEV6-Pre: 2.57 L
FEV6FVC-%Change-Post: 0 %
FEV6FVC-%Pred-Post: 104 %
FEV6FVC-%Pred-Pre: 103 %
FVC-%Change-Post: -3 %
FVC-%Pred-Post: 83 %
FVC-%Pred-Pre: 85 %
FVC-Post: 2.55 L
FVC-Pre: 2.64 L
Post FEV1/FVC ratio: 65 %
Post FEV6/FVC ratio: 100 %
Pre FEV1/FVC ratio: 76 %
Pre FEV6/FVC Ratio: 100 %
RV % pred: 75 %
RV: 1.53 L
TLC % pred: 84 %
TLC: 4.13 L

## 2022-11-25 NOTE — Patient Instructions (Addendum)
I am glad your cough has improved. Please continue your valsartan, Nasacort nasal spray, Zyrtec as you have been using them We reviewed your pulmonary function testing today.  There is evidence for a very beginnings of COPD.  We will hold off on starting an inhaled medication at this time.  We may decide to revisit this if your breathing, functional capacity change in the future. Congratulations on decreasing your cigarettes.  Please continue to work on stopping altogether. Follow Dr. Delton Coombes in 1 year or sooner if you have any new breathing symptoms.

## 2022-11-25 NOTE — Progress Notes (Signed)
Full PFT performed today. °

## 2022-11-25 NOTE — Assessment & Plan Note (Signed)
Discussed cessation with her today.  She has cut down and is working on stopping altogether.

## 2022-11-25 NOTE — Progress Notes (Signed)
Subjective:    Patient ID: Allison [name], female    DOB: August 26, 1960, 62 y.o.   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 She has a hx of chronic cough - has had bouts of intermittent cough, most recent bout began in December and has persisted. Cough is dry. Sometimes loses her voice. Can wake her up at night. Seems to be at its worst in the am when she gets up. Also when she sips water - is not getting choked. She doesn't feel any daily reflux. She was recently started on triamcinolone NS, omeprazole, amitryptline by ENT who noted that her posterior pharynx / glottis. She is on an ACE-I.  She has some occasional exertional SOB.   ROV 11/25/2022 --follow-up visit for 62 year old woman with history of tobacco use (40 pack years), rheumatoid arthritis on hydroxychloroquine, GERD with LPR, hypertension, hypothyroidism.  She has been dealing with chronic cough, first seen by me in April 2024.  Felt that she was having contributions of GERD, possible intermittent microaspiration.  She was also on an ACE inhibitor which I changed to valsartan.  She underwent pulmonary function testing today. She is smoking 1-2 cig a day. Cough is better - zyrtec, nasacort, omeprazole.  Her functional capacity is good. No wheeze.    Pulmonary function testing 11/25/2022 reviewed by me, shows suspected obstruction based on her postbronchodilator trials.  Her prebronchodilator trial actually showed normal airflows.  FEV1 ranged from 1.65 L to 2.01 L.  Restriction suggested by decreased RV.  Normal diffusion capacity.   Review of Systems As per HPI  Past Medical History:  Diagnosis Date   Anemia    Anxiety    Depression    GERD (gastroesophageal reflux disease)    diet controlled , tums occasional   Hemorrhoids    Hyperlipidemia    Hypertension     Kidney stones    Rheumatoid arthritis (HCC)    Thyroid disease      Family History  Problem Relation Age of Onset   Asthma Mother    Heart disease Father    Osteoarthritis Sister    Colon polyps Brother    Colon polyps Brother    Heart disease Brother    Thyroid disease Daughter    Colon cancer Neg Hx    Rectal cancer Neg Hx    Stomach cancer Neg Hx    Esophageal cancer Neg Hx      Social History   Socioeconomic History   Marital status: Married    Spouse name: Not on file   Number of children: Not on file   Years of education: Not on file   Highest education level: Not on file  Occupational History   Not on file  Tobacco Use   Smoking status: Former    Packs/day: 1.00    Years: 40.00    Additional pack years: 0.00    Total pack years: 40.00    Types: Cigarettes    Quit date: 2020    Years since quitting: 4.5    Passive exposure: Past   Smokeless tobacco: Never  Vaping Use   Vaping Use: Former  Substance and Sexual Activity   Alcohol use: Yes    Comment: socially   Drug use: Never   Sexual activity: Not on file  Other Topics Concern   Not on file  Social History Narrative  Not on file   Social Determinants of Health   Financial Resource Strain: Not on file  Food Insecurity: Not on file  Transportation Needs: Not on file  Physical Activity: Not on file  Stress: Not on file  Social Connections: Not on file  Intimate Partner Violence: Not on file     Allergies  Allergen Reactions   Ezetimibe Other (See Comments)   Latex Itching   Penicillin G Hives   Sulfa Antibiotics Hives     Outpatient Medications Prior to Visit  Medication Sig Dispense Refill   ALPRAZolam (XANAX) 0.5 MG tablet 1 tab(s)     amitriptyline (ELAVIL) 10 MG tablet TAKE 1 TABLET BY MOUTH EVERY DAY AT BEDTIME FOR 30 DAYS     aspirin 81 MG EC tablet TAKE 1 TABLET BY MOUTH EVERY DAY     atorvastatin (LIPITOR) 80 MG tablet TAKE 1 TABLET BY MOUTH ONE TIME DAILY AT BEDTIME      cetirizine (ZYRTEC) 10 MG tablet Take 10 mg by mouth as needed.     DULoxetine (CYMBALTA) 20 MG capsule Take 20 mg by mouth daily.     Ferrous Sulfate (IRON PO) Take by mouth daily.     FIBER PO Take by mouth 2 (two) times daily.     gabapentin (NEURONTIN) 300 MG capsule Take 1 capsule (300 mg total) by mouth 2 (two) times daily. 180 capsule 1   hydroxychloroquine (PLAQUENIL) 200 MG tablet Take 2 tablets (400 mg total) by mouth daily. 180 tablet 1   ibuprofen (ADVIL) 800 MG tablet TAKE 1 TABLET (800 MG TOTAL) BY MOUTH DAILY AS NEEDED. 30 tablet 5   levothyroxine (SYNTHROID) 150 MCG tablet Take 1 tablet (150 mcg total) by mouth daily. 90 tablet 3   Multiple Vitamin (MULTIVITAMIN PO) Take by mouth daily.     Omeprazole 20 MG TBEC PLEASE SEE ATTACHED FOR DETAILED DIRECTIONS     sertraline (ZOLOFT) 100 MG tablet 1 tab(s)     triamcinolone (NASACORT) 55 MCG/ACT AERO nasal inhaler SPRAY 2 SPRAYS EVERY DAY BY INTRANASAL ROUTE FOR 45 DAYS.     valsartan (DIOVAN) 80 MG tablet Take 1 tablet (80 mg total) by mouth daily. 30 tablet 2   No facility-administered medications prior to visit.        Objective:   Physical Exam  Vitals:   11/25/22 0954  BP: 134/80  Pulse: 86  Temp: 98.9 F (37.2 C)  TempSrc: Oral  SpO2: 97%  Weight: 185 lb (83.9 kg)  Height: 5\' 3"  (1.6 m)   Gen: Pleasant, well-nourished, in no distress,  normal affect  ENT: No lesions,  mouth clear, no nasal drainage but she does sound congested.  S strong voice  Neck: No JVD, no stridor  Lungs: No use of accessory muscles, good air movement.  Clear bilaterally no wheeze.  No referred noise  Musculoskeletal: No deformities, no cyanosis or clubbing  Neuro: alert, awake, non focal  Skin: Warm, no lesions or rashes      Assessment & Plan:  Chronic cough Cough is significantly improved, likely multiple reasons including better treatment of GERD, chronic rhinitis.  Also changing her enalapril HCTZ over to valsartan.   Finally she has decreased her smoking significantly and is trying to stop altogether.  COPD (chronic obstructive pulmonary disease) (HCC) Obstruction noted on her pulmonary function testing without significant symptoms, COPD stage A.  We talked about the pros and cons of bronchodilator therapy.  We will hold off for now, consider starting at  some point in the future if her breathing or functional capacity change.  Tobacco use Discussed cessation with her today.  She has cut down and is working on stopping altogether.   Levy Pupa, MD, PhD 11/25/2022, 10:29 AM [facility] [phone] or if no answer before 7:00PM call [phone] For any issues after 7:00PM please call eLink [phone]

## 2022-11-25 NOTE — Assessment & Plan Note (Signed)
Cough is significantly improved, likely multiple reasons including better treatment of GERD, chronic rhinitis.  Also changing her enalapril HCTZ over to valsartan.  Finally she has decreased her smoking significantly and is trying to stop altogether.

## 2022-11-25 NOTE — Patient Instructions (Signed)
Full PFT performed today. °

## 2022-11-25 NOTE — Assessment & Plan Note (Signed)
Obstruction noted on her pulmonary function testing without significant symptoms, COPD stage A.  We talked about the pros and cons of bronchodilator therapy.  We will hold off for now, consider starting at some point in the future if her breathing or functional capacity change.

## 2022-12-02 ENCOUNTER — Other Ambulatory Visit: Payer: Self-pay | Admitting: Emergency Medicine

## 2022-12-11 ENCOUNTER — Encounter: Payer: Self-pay | Admitting: Internal Medicine

## 2022-12-11 ENCOUNTER — Encounter: Payer: Self-pay | Admitting: Gastroenterology

## 2022-12-11 ENCOUNTER — Ambulatory Visit: Payer: 59 | Admitting: Internal Medicine

## 2022-12-11 VITALS — BP 132/84 | HR 87 | Ht 63.0 in | Wt 185.0 lb

## 2022-12-11 DIAGNOSIS — E039 Hypothyroidism, unspecified: Secondary | ICD-10-CM | POA: Diagnosis not present

## 2022-12-11 DIAGNOSIS — R600 Localized edema: Secondary | ICD-10-CM

## 2022-12-11 DIAGNOSIS — E041 Nontoxic single thyroid nodule: Secondary | ICD-10-CM | POA: Diagnosis not present

## 2022-12-11 LAB — TSH: TSH: 0.41 u[IU]/mL (ref 0.35–5.50)

## 2022-12-11 MED ORDER — LEVOTHYROXINE SODIUM 150 MCG PO TABS
150.0000 ug | ORAL_TABLET | Freq: Every day | ORAL | 3 refills | Status: DC
Start: 2022-12-11 — End: 2023-12-13

## 2022-12-11 NOTE — Progress Notes (Signed)
Name: Allison [name]  MRN/ DOB: [id number], May 27, 1960    Age/ Sex: 62 y.o., female    PCP: Hodnett, Bubba Camp, FNP   Reason for Endocrinology Evaluation: Hypothyroidism     Date of Initial Endocrinology Evaluation: 07/25/2021    HPI: [name] is a 62 y.o. female with a past medical history of RA and Hypothyroidism. The patient presented for initial endocrinology clinic visit on 07/25/2021 for consultative assistance with her Hypothyroidism.   She has been diagnosed with hypothyroidism in her adult life . She has been on Levothyroxine  for years   She has noted local neck symptoms  with intermittent swelling. Has been diagnosed with right thyroid nodule in 2019 , was lost to follow-up after that, no history of FNAs   In January 2023 she has been noted with a TSH of 0.109 u IU/mL, she was on levothyroxine 175 +88 mcg daily, this was reduced to levothyroxine 175 +25 mcg with repeat TSH 0.069 uIU/mL  We have reduced her levothyroxine dose to optimize TSH  Repeat ultrasound on May/12/2021 revealed a stable 1.5 right centimeter thyroid nodule but due to peripheral calcifications and FNA has been recommended.  Unfortunately biopsy had to be halted after 3 attempts due to the patient having sore throat. Another attempt was made 12/2021 but the sample was acellular   No family history of thyroid disease   Transferred care from Dr. Evlyn Kanner  SUBJECTIVE:    Today (12/11/22): Ms. Wearing is here for follow-up on hypothyroidism and right thyroid nodule.   Weight has been stable  Denies local neck swelling  Denies palpitations  Mild constipation but no diarrhea  She has pending EGD for chronic cough and colonoscopy No biotin intake    Levothyroxine 150 mcg daily    HISTORY:  Past Medical History:  Past Medical History:  Diagnosis Date   Anemia    Anxiety    Depression    GERD (gastroesophageal reflux disease)    diet controlled , tums occasional   Hemorrhoids    Hyperlipidemia     Hypertension    Kidney stones    Rheumatoid arthritis (HCC)    Thyroid disease    Past Surgical History:  Past Surgical History:  Procedure Laterality Date   APPENDECTOMY     arm surgery Right    nerve moved   BACK SURGERY     x 2 - lumbar - replaced 2 disc   CHOLECYSTECTOMY     COLONOSCOPY  07/2014   hx polyps [place]   kidney stones     x 2 - removed by surgery   LAPAROTOMY     cyst removed from ovary   SHOULDER SURGERY Right    TUBAL LIGATION     WISDOM TOOTH EXTRACTION      Social History:  reports that she quit smoking about 4 years ago. Her smoking use included cigarettes. She started smoking about 44 years ago. She has a 40 pack-year smoking history. She has been exposed to tobacco smoke. She has never used smokeless tobacco. She reports current alcohol use. She reports that she does not use drugs. Family History: family history includes Asthma in her mother; Colon polyps in her brother and brother; Heart disease in her brother and father; Osteoarthritis in her sister; Thyroid disease in her daughter.   HOME MEDICATIONS: Allergies as of 12/11/2022       Reactions   Ezetimibe Other (See Comments)   Latex Itching   Penicillin G Hives  Sulfa Antibiotics Hives        Medication List        Accurate as of December 11, 2022  9:53 AM. If you have any questions, ask your nurse or doctor.          ALPRAZolam 0.5 MG tablet Commonly known as: XANAX 1 tab(s)   amitriptyline 10 MG tablet Commonly known as: ELAVIL TAKE 1 TABLET BY MOUTH EVERY DAY AT BEDTIME FOR 30 DAYS   aspirin EC 81 MG tablet TAKE 1 TABLET BY MOUTH EVERY DAY   atorvastatin 80 MG tablet Commonly known as: LIPITOR TAKE 1 TABLET BY MOUTH ONE TIME DAILY AT BEDTIME   cetirizine 10 MG tablet Commonly known as: ZYRTEC Take 10 mg by mouth as needed.   DULoxetine 20 MG capsule Commonly known as: CYMBALTA Take 20 mg by mouth daily.   FIBER PO Take by mouth 2 (two) times daily.    gabapentin 300 MG capsule Commonly known as: NEURONTIN Take 1 capsule (300 mg total) by mouth 2 (two) times daily.   hydroxychloroquine 200 MG tablet Commonly known as: PLAQUENIL Take 2 tablets (400 mg total) by mouth daily.   ibuprofen 800 MG tablet Commonly known as: ADVIL TAKE 1 TABLET (800 MG TOTAL) BY MOUTH DAILY AS NEEDED.   IRON PO Take by mouth daily.   levothyroxine 150 MCG tablet Commonly known as: SYNTHROID Take 1 tablet (150 mcg total) by mouth daily.   MULTIVITAMIN PO Take by mouth daily.   Omeprazole 20 MG Tbec PLEASE SEE ATTACHED FOR DETAILED DIRECTIONS   sertraline 100 MG tablet Commonly known as: ZOLOFT 1 tab(s)   triamcinolone 55 MCG/ACT Aero nasal inhaler Commonly known as: NASACORT SPRAY 2 SPRAYS EVERY DAY BY INTRANASAL ROUTE FOR 45 DAYS.   valsartan 80 MG tablet Commonly known as: DIOVAN TAKE 1 TABLET BY MOUTH EVERY DAY          REVIEW OF SYSTEMS: A comprehensive ROS was conducted with the patient and is negative except as per HPI    OBJECTIVE:  VS: BP 132/84 (BP Location: Left Arm, Patient Position: Sitting, Cuff Size: Large)   Pulse 87   Ht 5\' 3"  (1.6 m)   Wt 185 lb (83.9 kg)   SpO2 97%   BMI 32.77 kg/m    Wt Readings from Last 3 Encounters:  12/11/22 185 lb (83.9 kg)  11/25/22 185 lb (83.9 kg)  11/11/22 185 lb (83.9 kg)     EXAM: General: Pt appears well and is in NAD  Neck: General: Supple without adenopathy. Thyroid: Minimal asymmetry on the right, but unable to appreciate an actual nodule  Lungs: Clear with good BS bilat   Heart: Auscultation: RRR.  Extremities:  BL LE: trace pretibial edema   Mental Status: Judgment, insight: Intact Orientation: Oriented to time, place, and person Mood and affect: No depression, anxiety, or agitation     DATA REVIEWED:   Latest Reference Range & Units 12/11/22 10:11  TSH 0.35 - 5.50 uIU/mL 0.41    Thyroid ultrasound 09/19/2021  Nodule # 1:   Location: Right; Inferior    Maximum size: 1.5, previously 1.7 cm; Other 2 dimensions: 1.3 x 1.0 cm   Composition: solid/almost completely solid (2)   Echogenicity: isoechoic (1)   Shape: not taller-than-wide (0)   Margins: ill-defined (0)   Echogenic foci: peripheral calcifications (2)   ACR TI-RADS total points: 5.   ACR TI-RADS risk category: TR4 (4-6 points).   ACR TI-RADS recommendations:   **Given size (>/=  1.5 cm) and appearance, fine needle aspiration of this moderately suspicious nodule should be considered based on TI-RADS criteria. However, stability dating back to 07/12/2017 supports a benign process. Therefore continued follow-up could also be performed.   _________________________________________________________   Stable thyroid heterogeneity and mild enlargement. No hypervascularity. No regional adenopathy.   IMPRESSION: Stable 1.5 cm right inferior thyroid TR 4 nodule dating back to 2019. See above comment.    FNA 12/18/2021  Clinical History: Nodule #1: Right; Inferior, Maximum size: 1.5 cm,  previously 1.7 cm; Other 2 dimensions: 1.3 x 1.0 cm, solid/almost  completely solid, isoechoic, Echogenic foci: peripheral calcifications,  TI-RADS total points 5. Previously biopsied 10/02/21  Specimen Submitted:  A. THYROID, RT INFERIOR, FINE NEEDLE ASPIRATION:    FINAL MICROSCOPIC DIAGNOSIS:  - Nondiagnostic material   SPECIMEN ADEQUACY:  INSUFFICIENT FOR DIAGNOSIS. The specimen is processed and examined  microscopically, but found to be UNSATISFACTORY for evaluation.   DIAGNOSTIC COMMENTS:  The specimen is essentially acellular.   ASSESSMENT/PLAN/RECOMMENDATIONS:   Hypothyroidism:  -Pt is euthyroid  -TSH remains normal  -No change  Medications :  Continue levothyroxine 150 mcg daily  2. Right thyroid nodule   - Right inferior 1.5 cm nodule  with calcifications, an attempt at FNA was unsuccessful  in 09/2021 as the pt did not tolerate the procedure. S/P FNA in 12/2021  with insufficient cellularity  -We did discuss the high risk of malignancy with calcifications, patient is not interested in thyroid surgery, we have opted to proceed with repeat thyroid ultrasound and revisit depending on thyroid nodule features  3. LE edema :    -We discussed the importance of avoiding salt and leg elevation    Follow-up in 1 yr   Signed electronically by: Lyndle Herrlich, MD  [facility] Endocrinology  [facility] [address] [place], [place] [address] Phone: [phone] FAX: [phone]   CC: Hodnett, Bubba Camp, FNP [address] [place] [address] Phone: [phone] Fax: [phone]   Return to Endocrinology clinic as below: Future Appointments  Date Time Provider Department Center  12/11/2022 10:10 AM Zaylyn Bergdoll, Konrad Dolores, MD [facility] None  12/18/2022  3:30 PM Armbruster, Willaim Rayas, MD [facility]  03/12/2023 10:20 AM Dimple Casey, Jamesetta Orleans, MD [facility] None

## 2022-12-11 NOTE — Patient Instructions (Signed)

## 2022-12-18 ENCOUNTER — Ambulatory Visit (AMBULATORY_SURGERY_CENTER): Payer: 59 | Admitting: Gastroenterology

## 2022-12-18 ENCOUNTER — Encounter: Payer: Self-pay | Admitting: Gastroenterology

## 2022-12-18 VITALS — BP 112/55 | HR 62 | Temp 98.6°F | Resp 16 | Ht 63.0 in | Wt 185.0 lb

## 2022-12-18 DIAGNOSIS — R1013 Epigastric pain: Secondary | ICD-10-CM

## 2022-12-18 DIAGNOSIS — K2970 Gastritis, unspecified, without bleeding: Secondary | ICD-10-CM | POA: Diagnosis not present

## 2022-12-18 DIAGNOSIS — K639 Disease of intestine, unspecified: Secondary | ICD-10-CM

## 2022-12-18 DIAGNOSIS — D123 Benign neoplasm of transverse colon: Secondary | ICD-10-CM

## 2022-12-18 DIAGNOSIS — Z86010 Personal history of colonic polyps: Secondary | ICD-10-CM

## 2022-12-18 DIAGNOSIS — D125 Benign neoplasm of sigmoid colon: Secondary | ICD-10-CM | POA: Diagnosis not present

## 2022-12-18 DIAGNOSIS — D509 Iron deficiency anemia, unspecified: Secondary | ICD-10-CM

## 2022-12-18 DIAGNOSIS — K319 Disease of stomach and duodenum, unspecified: Secondary | ICD-10-CM

## 2022-12-18 DIAGNOSIS — R053 Chronic cough: Secondary | ICD-10-CM

## 2022-12-18 DIAGNOSIS — K222 Esophageal obstruction: Secondary | ICD-10-CM | POA: Diagnosis not present

## 2022-12-18 DIAGNOSIS — Z09 Encounter for follow-up examination after completed treatment for conditions other than malignant neoplasm: Secondary | ICD-10-CM | POA: Diagnosis not present

## 2022-12-18 DIAGNOSIS — K219 Gastro-esophageal reflux disease without esophagitis: Secondary | ICD-10-CM

## 2022-12-18 MED ORDER — OMEPRAZOLE 20 MG PO CPDR: 20.0000 mg | DELAYED_RELEASE_CAPSULE | Freq: Every day | ORAL | 3 refills | Status: DC

## 2022-12-18 MED ORDER — SODIUM CHLORIDE 0.9 % IV SOLN: 500.0000 mL | Freq: Once | INTRAVENOUS | Status: AC

## 2022-12-18 NOTE — Op Note (Signed)
[facility] Patient Name: Allison [name] Procedure Date: 12/18/2022 2:25 PM MRN: [id number] Endoscopist: Viviann Spare P. Adela Lank , MD, [id number] Age: 62 Referring MD:  Date of Birth: 1960-11-22 Gender: Female Account #: [id number] Procedure:                Colonoscopy Indications:              High risk colon cancer surveillance: Personal                            history of colonic polyps - last exam 07/2019 - 1cm                            sessile serrated polyp removed in addition to other                            smaller polyps, history of iron deficiency Medicines:                Monitored Anesthesia Care Procedure:                Pre-Anesthesia Assessment:                           - Prior to the procedure, a History and Physical                            was performed, and patient medications and                            allergies were reviewed. The patient's tolerance of                            previous anesthesia was also reviewed. The risks                            and benefits of the procedure and the sedation                            options and risks were discussed with the patient.                            All questions were answered, and informed consent                            was obtained. Prior Anticoagulants: The patient has                            taken no anticoagulant or antiplatelet agents. ASA                            Grade Assessment: II - A patient with mild systemic                            disease. After reviewing the risks and benefits,  the patient was deemed in satisfactory condition to                            undergo the procedure.                           After obtaining informed consent, the colonoscope                            was passed under direct vision. Throughout the                            procedure, the patient's blood pressure, pulse, and                            oxygen  saturations were monitored continuously. The                            Olympus [id number]-[id number] (#[id number]) Colonoscope was                            introduced through the anus and advanced to the the                            terminal ileum, with identification of the                            appendiceal orifice and IC valve. The colonoscopy                            was performed without difficulty. The patient                            tolerated the procedure well. The quality of the                            bowel preparation was adequate. The terminal ileum,                            ileocecal valve, appendiceal orifice, and rectum                            were photographed. Scope In: 2:44:21 PM Scope Out: 3:08:20 PM Scope Withdrawal Time: 0 hours 19 minutes 42 seconds  Total Procedure Duration: 0 hours 23 minutes 59 seconds  Findings:                 The perianal and digital rectal examinations were                            normal.                           The terminal ileum appeared normal.  A 3 to 4 mm polyp was found in the transverse                            colon. The polyp was sessile. The polyp was removed                            with a cold snare. Resection and retrieval were                            complete.                           A 3 mm polyp was found in the sigmoid colon. The                            polyp was sessile. The polyp was removed with a                            cold snare. Resection was complete, but the polyp                            tissue was not retrieved.                           Scattered small-mouthed diverticula were found in                            the left colon.                           Anal papilla(e) were hypertrophied.                           Internal hemorrhoids were found during                            retroflexion. The hemorrhoids were small.                           The exam was  otherwise without abnormality. Complications:            No immediate complications. Estimated blood loss:                            Minimal. Estimated Blood Loss:     Estimated blood loss was minimal. Impression:               - The examined portion of the ileum was normal.                           - One 3 to 4 mm polyp in the transverse colon,                            removed with a cold snare. Resected and retrieved.                           -  One 3 mm polyp in the sigmoid colon, removed with                            a cold snare. Complete resection. Polyp tissue not                            retrieved.                           - Diverticulosis in the left colon.                           - Anal papilla(e) were hypertrophied.                           - Internal hemorrhoids.                           - The examination was otherwise normal.                           Of note, given breathing pattern observed while                            sedated, recommend sleep study to assess for                            underlying sleep apnea. Recommendation:           - Patient has a contact number available for                            emergencies. The signs and symptoms of potential                            delayed complications were discussed with the                            patient. Return to normal activities tomorrow.                            Written discharge instructions were provided to the                            patient.                           - Resume previous diet.                           - Continue present medications.                           - Await pathology results.                           - Anticipate repeat colonoscopy in 5 years given  advance polyp removed on the last exam in 2021. Viviann Spare P. Marilla Boddy, MD 12/18/2022 3:16:33 PM This report has been signed electronically.

## 2022-12-18 NOTE — Progress Notes (Signed)
Sedate, gd SR, tolerated procedure well, VSS, report to RN 

## 2022-12-18 NOTE — Patient Instructions (Signed)
Impression/Recommendations:  Polyp, diverticulosis, hemorrhoid, gastritis, and hiatal hernia handouts given to patient.  Resume previous diet. Continue present medications.  Avoid NSAIDs.    Increase Omeprazole to 20 mg. Twice daily for 6 weeks to treat gastritis.  Await pathology results for further recommendations.  Repeat colonoscopy recommended for surveillance.  Date to be determined after pathology results reviewed.  Recommend sleep study to assess for sleep apnea.  YOU HAD AN ENDOSCOPIC PROCEDURE TODAY AT THE [facility]:   Refer to the procedure report that was given to you for any specific questions about what was found during the examination.  If the procedure report does not answer your questions, please call your gastroenterologist to clarify.  If you requested that your care partner not be given the details of your procedure findings, then the procedure report has been included in a sealed envelope for you to review at your convenience later.  YOU SHOULD EXPECT: Some feelings of bloating in the abdomen. Passage of more gas than usual.  Walking can help get rid of the air that was put into your GI tract during the procedure and reduce the bloating. If you had a lower endoscopy (such as a colonoscopy or flexible sigmoidoscopy) you may notice spotting of blood in your stool or on the toilet paper. If you underwent a bowel prep for your procedure, you may not have a normal bowel movement for a few days.  Please Note:  You might notice some irritation and congestion in your nose or some drainage.  This is from the oxygen used during your procedure.  There is no need for concern and it should clear up in a day or so.  SYMPTOMS TO REPORT IMMEDIATELY:  Following lower endoscopy (colonoscopy or flexible sigmoidoscopy):  Excessive amounts of blood in the stool  Significant tenderness or worsening of abdominal pains  Swelling of the abdomen that is new, acute  Fever of  100F or higher  Following upper endoscopy (EGD)  Vomiting of blood or coffee ground material  New chest pain or pain under the shoulder blades  Painful or persistently difficult swallowing  New shortness of breath  Fever of 100F or higher  Black, tarry-looking stools  For urgent or emergent issues, a gastroenterologist can be reached at any hour by calling [phone]. Do not use MyChart messaging for urgent concerns.    DIET:  We do recommend a small meal at first, but then you may proceed to your regular diet.  Drink plenty of fluids but you should avoid alcoholic beverages for 24 hours.  ACTIVITY:  You should plan to take it easy for the rest of today and you should NOT DRIVE or use heavy machinery until tomorrow (because of the sedation medicines used during the test).    FOLLOW UP: Our staff will call the number listed on your records the next business day following your procedure.  We will call around 7:15- 8:00 am to check on you and address any questions or concerns that you may have regarding the information given to you following your procedure. If we do not reach you, we will leave a message.     If any biopsies were taken you will be contacted by phone or by letter within the next 1-3 weeks.  Please call us at [phone] if you have not heard about the biopsies in 3 weeks.    SIGNATURES/CONFIDENTIALITY: You and/or your care partner have signed paperwork which will be entered into your electronic medical  record.  These signatures attest to the fact that that the information above on your After Visit Summary has been reviewed and is understood.  Full responsibility of the confidentiality of this discharge information lies with you and/or your care-partner.

## 2022-12-18 NOTE — Op Note (Signed)
[facility] Patient Name: Allison [name] Procedure Date: 12/18/2022 2:25 PM MRN: [id number] Endoscopist: Viviann Spare P. Adela Lank , MD, [id number] Age: 62 Referring MD:  Date of Birth: Jun 14, 1960 Gender: Female Account #: [id number] Procedure:                Upper GI endoscopy Indications:              history of iron deficiency anemia, history of                            suspected gastro-esophageal reflux disease, Chronic                            cough, and epigastric pain - improved on omeprazole                            and treatment for allergies Medicines:                Monitored Anesthesia Care Procedure:                Pre-Anesthesia Assessment:                           - Prior to the procedure, a History and Physical                            was performed, and patient medications and                            allergies were reviewed. The patient's tolerance of                            previous anesthesia was also reviewed. The risks                            and benefits of the procedure and the sedation                            options and risks were discussed with the patient.                            All questions were answered, and informed consent                            was obtained. Prior Anticoagulants: The patient has                            taken no anticoagulant or antiplatelet agents. ASA                            Grade Assessment: II - A patient with mild systemic                            disease. After reviewing the risks and benefits,  the patient was deemed in satisfactory condition to                            undergo the procedure.                           After obtaining informed consent, the endoscope was                            passed under direct vision. Throughout the                            procedure, the patient's blood pressure, pulse, and                            oxygen saturations were  monitored continuously. The                            GIF [id number] #[id number] was introduced through the                            mouth, and advanced to the second part of duodenum.                            The upper GI endoscopy was accomplished without                            difficulty. The patient tolerated the procedure                            well. Scope In: Scope Out: Findings:                 Esophagogastric landmarks were identified: the                            Z-line was found at 34 cm, the gastroesophageal                            junction was found at 34 cm and the upper extent of                            the gastric folds was found at 37 cm from the                            incisors.                           A 3 cm hiatal hernia was present.                           The Z-line was slightly irregular with an extension                            of salmon colored mucosa roughly  3mm in length                            without nodularity (see photo). Did not meet                            criteria for Barrett's esophagus / biopsies.                           A widely patent Schatzki ring was found at the                            gastroesophageal junction.                           The exam of the esophagus was otherwise normal.                           Inflammation characterized by erosions, erythema,                            granularity and shallow ulcerations was found in                            the gastric antrum.                           The exam of the stomach was otherwise normal.                           Biopsies were taken with a cold forceps for                            Helicobacter pylori testing.                           The examined duodenum was normal. Complications:            No immediate complications. Estimated blood loss:                            Minimal. Estimated Blood Loss:     Estimated blood loss was minimal. Impression:                - Esophagogastric landmarks identified.                           - 3 cm hiatal hernia.                           - Slightly irregular Z-line as outlined - did not                            meet criteria for Barrett's.                           - Widely patent Schatzki ring.                           -  Normal esophagus otherwise.                           - Antral gastritis.                           - Normal stomach otherwise - biopsies taken to rule                            out H pylori.                           - Normal examined duodenum.                           GERD could be contributing to cough in the setting                            of hiatal hernia, improved on PPI, no erosive                            changes / Barrett's. Gastritis could be                            contributing / cause of iron deficiency. Previously                            used NSAIDs but stopped. Recommendation:           - Patient has a contact number available for                            emergencies. The signs and symptoms of potential                            delayed complications were discussed with the                            patient. Return to normal activities tomorrow.                            Written discharge instructions were provided to the                            patient.                           - Resume previous diet.                           - Continue present medications.                           - Increase omeprazole to 20mg  twice daily for 6                            weeks to  treat gastritis                           - Avoid NSAIDs                           - Await pathology results with further                            recommendations. Viviann Spare P. Kanani Mowbray, MD 12/18/2022 3:24:36 PM This report has been signed electronically.

## 2022-12-18 NOTE — Progress Notes (Signed)
Called to room to assist during endoscopic procedure.  Patient ID and intended procedure confirmed with present staff. Received instructions for my participation in the procedure from the performing physician.  

## 2022-12-18 NOTE — Progress Notes (Signed)
[facility] History and Physical   Primary Care Physician:  Laverda Sorenson, FNP   Reason for Procedure:   History of colon polyps, history of iron deficiency, chronic cough, GERD, epigastric pain  Plan:    EGD and colonoscopy     HPI: Allison [name] is a 62 y.o. female  here for colonoscopy and EGD to evaluate issues as outlined. Last exam 07/2019, 1 cm SSP removed amongst other polyps. She had issues with chronic cough and occasional GERD / epigastric pain, treated with omeprazole as well as treated for allergies and her symptoms improved. Iron studies recently were more c/w that of chronic disease.   Otherwise feels well without any cardiopulmonary symptoms.   I have discussed risks / benefits of anesthesia and endoscopic procedure with Allison [name] and they wish to proceed with the exams as outlined today.    Past Medical History:  Diagnosis Date   Anemia    Anxiety    Depression    GERD (gastroesophageal reflux disease)    diet controlled , tums occasional   Hemorrhoids    Hyperlipidemia    Hypertension    Kidney stones    Rheumatoid arthritis (HCC)    Thyroid disease     Past Surgical History:  Procedure Laterality Date   APPENDECTOMY     arm surgery Right    nerve moved   BACK SURGERY     x 2 - lumbar - replaced 2 disc   CHOLECYSTECTOMY     COLONOSCOPY  07/2014   hx polyps [place]   kidney stones     x 2 - removed by surgery   LAPAROTOMY     cyst removed from ovary   SHOULDER SURGERY Right    TUBAL LIGATION     WISDOM TOOTH EXTRACTION      Prior to Admission medications   Medication Sig Start Date End Date Taking? Authorizing Provider  aspirin 81 MG EC tablet TAKE 1 TABLET BY MOUTH EVERY DAY   Yes [provider]  atorvastatin (LIPITOR) 80 MG tablet TAKE 1 TABLET BY MOUTH ONE TIME DAILY AT BEDTIME   Yes [provider]  cetirizine (ZYRTEC) 10 MG tablet Take 10 mg by mouth as needed. 12/13/21  Yes [provider]   DULoxetine (CYMBALTA) 20 MG capsule Take 20 mg by mouth daily.   Yes [provider]  gabapentin (NEURONTIN) 300 MG capsule Take 1 capsule (300 mg total) by mouth 2 (two) times daily. 09/04/22  Yes Rice, Jamesetta Orleans, MD  hydroxychloroquine (PLAQUENIL) 200 MG tablet Take 2 tablets (400 mg total) by mouth daily. 09/04/22  Yes Rice, Jamesetta Orleans, MD  levothyroxine (SYNTHROID) 150 MCG tablet Take 1 tablet (150 mcg total) by mouth daily. 12/11/22  Yes Shamleffer, Konrad Dolores, MD  Multiple Vitamin (MULTIVITAMIN PO) Take by mouth daily.   Yes [provider]  Omeprazole 20 MG TBEC PLEASE SEE ATTACHED FOR DETAILED DIRECTIONS 08/17/22  Yes [provider]  sertraline (ZOLOFT) 100 MG tablet 1 tab(s)   Yes [provider]  valsartan (DIOVAN) 80 MG tablet TAKE 1 TABLET BY MOUTH EVERY DAY 12/02/22  Yes Byrum, Les Pou, MD  ALPRAZolam Prudy Feeler) 0.5 MG tablet 1 tab(s) 05/21/17   [provider]  amitriptyline (ELAVIL) 10 MG tablet TAKE 1 TABLET BY MOUTH EVERY DAY AT BEDTIME FOR 30 DAYS Patient not taking: Reported on 12/18/2022 08/17/22   [provider]  Ferrous Sulfate (IRON PO) Take by mouth daily.    [provider]  FIBER PO Take by mouth 2 (two) times daily.    [provider]  ibuprofen (ADVIL) 800 MG tablet TAKE 1 TABLET (800 MG TOTAL) BY MOUTH DAILY AS NEEDED. 08/31/22   Rice, Jamesetta Orleans, MD  triamcinolone (NASACORT) 55 MCG/ACT AERO nasal inhaler SPRAY 2 SPRAYS EVERY DAY BY INTRANASAL ROUTE FOR 45 DAYS. 08/17/22   [provider]    Current Outpatient Medications  Medication Sig Dispense Refill   aspirin 81 MG EC tablet TAKE 1 TABLET BY MOUTH EVERY DAY     atorvastatin (LIPITOR) 80 MG tablet TAKE 1 TABLET BY MOUTH ONE TIME DAILY AT BEDTIME     cetirizine (ZYRTEC) 10 MG tablet Take 10 mg by mouth as needed.     DULoxetine (CYMBALTA) 20 MG capsule Take 20 mg by mouth daily.     gabapentin (NEURONTIN) 300 MG capsule Take 1  capsule (300 mg total) by mouth 2 (two) times daily. 180 capsule 1   hydroxychloroquine (PLAQUENIL) 200 MG tablet Take 2 tablets (400 mg total) by mouth daily. 180 tablet 1   levothyroxine (SYNTHROID) 150 MCG tablet Take 1 tablet (150 mcg total) by mouth daily. 90 tablet 3   Multiple Vitamin (MULTIVITAMIN PO) Take by mouth daily.     Omeprazole 20 MG TBEC PLEASE SEE ATTACHED FOR DETAILED DIRECTIONS     sertraline (ZOLOFT) 100 MG tablet 1 tab(s)     valsartan (DIOVAN) 80 MG tablet TAKE 1 TABLET BY MOUTH EVERY DAY 90 tablet 2   ALPRAZolam (XANAX) 0.5 MG tablet 1 tab(s)     amitriptyline (ELAVIL) 10 MG tablet TAKE 1 TABLET BY MOUTH EVERY DAY AT BEDTIME FOR 30 DAYS (Patient not taking: Reported on 12/18/2022)     Ferrous Sulfate (IRON PO) Take by mouth daily.     FIBER PO Take by mouth 2 (two) times daily.     ibuprofen (ADVIL) 800 MG tablet TAKE 1 TABLET (800 MG TOTAL) BY MOUTH DAILY AS NEEDED. 30 tablet 5   triamcinolone (NASACORT) 55 MCG/ACT AERO nasal inhaler SPRAY 2 SPRAYS EVERY DAY BY INTRANASAL ROUTE FOR 45 DAYS.     Current Facility-Administered Medications  Medication Dose Route Frequency Provider Last Rate Last Admin   0.9 %  sodium chloride infusion  500 mL Intravenous Once Roper Tolson, Willaim Rayas, MD        Allergies as of 12/18/2022 - Review Complete 12/18/2022  Allergen Reaction Noted   Ezetimibe Other (See Comments) 05/29/2019   Latex Itching 05/29/2019   Penicillin g Hives 05/29/2019   Sulfa antibiotics Hives 05/29/2019    Family History  Problem Relation Age of Onset   Asthma Mother    Heart disease Father    Osteoarthritis Sister    Colon polyps Brother    Colon polyps Brother    Heart disease Brother    Thyroid disease Daughter    Colon cancer Neg Hx    Rectal cancer Neg Hx    Stomach cancer Neg Hx    Esophageal cancer Neg Hx     Social History   Socioeconomic History   Marital status: Married    Spouse name: Not on file   Number of children: Not on file    Years of education: Not on file   Highest education level: Not on file  Occupational History   Not on file  Tobacco Use   Smoking status: Former    Current packs/day: 0.00    Average packs/day: 1 pack/day for 40.0 years (40.0 ttl  pk-yrs)    Types: Cigarettes    Start date: 77    Quit date: 2020    Years since quitting: 4.5    Passive exposure: Past   Smokeless tobacco: Never  Vaping Use   Vaping status: Former  Substance and Sexual Activity   Alcohol use: Yes    Comment: socially   Drug use: Never   Sexual activity: Not on file  Other Topics Concern   Not on file  Social History Narrative   Not on file   Social Determinants of Health   Financial Resource Strain: Not on file  Food Insecurity: Not on file  Transportation Needs: Not on file  Physical Activity: Not on file  Stress: Not on file  Social Connections: Not on file  Intimate Partner Violence: Not on file    Review of Systems: All other review of systems negative except as mentioned in the HPI.  Physical Exam: Vital signs BP 139/86   Pulse 83   Temp 98.6 F (37 C)   Ht 5\' 3"  (1.6 m)   Wt 185 lb (83.9 kg)   SpO2 96%   BMI 32.77 kg/m   General:   Alert,  Well-developed, pleasant and cooperative in NAD Lungs:  Clear throughout to auscultation.   Heart:  Regular rate and rhythm Abdomen:  Soft, nontender and nondistended.   Neuro/Psych:  Alert and cooperative. Normal mood and affect. A and O x 3  Harlin Rain, MD [facility]

## 2022-12-21 ENCOUNTER — Other Ambulatory Visit: Payer: Self-pay | Admitting: Internal Medicine

## 2022-12-21 ENCOUNTER — Telehealth: Payer: Self-pay

## 2022-12-21 DIAGNOSIS — M0579 Rheumatoid arthritis with rheumatoid factor of multiple sites without organ or systems involvement: Secondary | ICD-10-CM

## 2022-12-21 NOTE — Telephone Encounter (Signed)
  Follow up Call-     12/18/2022    1:37 PM  Call back number  Post procedure Call Back phone  # [phone]  Permission to leave phone message Yes     Patient questions:  Do you have a fever, pain , or abdominal swelling? No. Pain Score  0 *  Have you tolerated food without any problems? Yes.    Have you been able to return to your normal activities? Yes.    Do you have any questions about your discharge instructions: Diet   No. Medications  No. Follow up visit  No.  Do you have questions or concerns about your Care? No.  Actions: * If pain score is 4 or above: No action needed, pain <4.

## 2022-12-24 ENCOUNTER — Other Ambulatory Visit: Payer: Self-pay | Admitting: *Deleted

## 2022-12-24 DIAGNOSIS — G4730 Sleep apnea, unspecified: Secondary | ICD-10-CM

## 2022-12-24 DIAGNOSIS — D509 Iron deficiency anemia, unspecified: Secondary | ICD-10-CM

## 2022-12-24 DIAGNOSIS — E611 Iron deficiency: Secondary | ICD-10-CM

## 2022-12-25 ENCOUNTER — Ambulatory Visit
Admission: RE | Admit: 2022-12-25 | Discharge: 2022-12-25 | Disposition: A | Discharge status: Home / Self Care | Payer: 59 | Source: Ambulatory Visit | Attending: Internal Medicine | Admitting: Internal Medicine

## 2022-12-25 DIAGNOSIS — E041 Nontoxic single thyroid nodule: Secondary | ICD-10-CM

## 2023-01-18 ENCOUNTER — Other Ambulatory Visit: Payer: Self-pay | Admitting: Gastroenterology

## 2023-01-19 ENCOUNTER — Other Ambulatory Visit (HOSPITAL_COMMUNITY): Payer: Self-pay

## 2023-01-19 ENCOUNTER — Telehealth: Payer: Self-pay

## 2023-01-19 NOTE — Telephone Encounter (Signed)
PA request has been Approved. New Encounter created for follow up. For additional info see Pharmacy Prior Auth telephone encounter from 09/03.

## 2023-01-19 NOTE — Telephone Encounter (Signed)
*  Gastro  Pharmacy Patient Advocate Encounter  Received notification from [facility] that Prior Authorization for Omeprazole 20MG  dr capsules  has been APPROVED from 01/19/2023 to 01/18/2026. Ran test claim, Copay is $5.00. This test claim was processed through [facility]- copay amounts may vary at other pharmacies due to pharmacy/plan contracts, or as the patient moves through the different stages of their insurance plan.   PA #/Case ID/Reference #: [id number]

## 2023-02-22 ENCOUNTER — Other Ambulatory Visit: Payer: Self-pay | Admitting: Internal Medicine

## 2023-02-22 DIAGNOSIS — G8929 Other chronic pain: Secondary | ICD-10-CM

## 2023-02-22 DIAGNOSIS — M0579 Rheumatoid arthritis with rheumatoid factor of multiple sites without organ or systems involvement: Secondary | ICD-10-CM

## 2023-02-22 NOTE — Telephone Encounter (Signed)
Last Fill: 08/31/2022  Labs: 09/04/2022 Lab results look fine there is no increase in sedimentation rate or CRP to indicate a increased systemic inflammation.   Next Visit: 03/12/2023  Last Visit: 09/04/2022  DX:  Rheumatoid arthritis with rheumatoid factor of multiple sites without organ or systems involvement   Current Dose per office note 09/04/2022: not discussed  Okay to refill Ibuprofen?

## 2023-03-01 ENCOUNTER — Telehealth: Payer: Self-pay | Admitting: Emergency Medicine

## 2023-03-01 NOTE — Progress Notes (Signed)
Office Visit Note  Patient: Allison [name]             Date of Birth: 05/31/1960           MRN: [id number]             PCP: Laverda Sorenson, FNP Referring: Laverda Sorenson, FNP Visit Date: 03/12/2023   Subjective:  Follow-up (Patient states she has more pain in her right hand around her fifth digit and her left knee. Patient states currently today she is okay but those are the two areas she has had a lot of issues with. )   History of Present Illness: Allison [name] is a 62 y.o. 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  Overall joint inflammation has been doing well still has frequent days with increased joint pain most commonly affecting her right fifth finger and at the left knee.  Often gets acute knee pain with weightbearing climbing stairs.  She finds using a knee brace at the start of increased pain is very helpful otherwise has had episodes where she developed visible swelling.  She takes ibuprofen 800 mg as needed estimates this is a few days per week.  She had upper and lower endoscopy in August with evidence of gastritis so was recommended to limit NSAID exposure.  Also with some polyps no other specific cause for her iron deficiency identified on the study.  Previous HPI 09/04/2022 Allison [name] is a 62 y.o. 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  Overall she been doing well from a joint standpoint though in the past few weeks has experienced increased pain and stiffness especially at her left second third MCP joints.  Sometimes swelling throughout the entire third digit.  This is varied in intensity from day-to-day usually when it is worse she gets some relief from the ibuprofen 800 mg as needed.  Also has problem with ongoing nonproductive cough for the past few months.  Has had previous episodes of prolonged chronic cough usually provoked by some type of illness she had URI symptoms back in December.  Just had  recent chest x-ray after follow-up in pulmonology clinic and switching from lisinopril to valsartan.    Previous HPI 03/06/22 Allison [name] is a 62 y.o. 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  Her inflammatory joint symptoms have been well improved she had one episode of increased pain in her hands he associated with adjustment to her home office desk and working computer configuration.  Besides this her main issue is pain at the low back.  This is most significant at the midline and left side occasional radiation down the left leg.  Symptoms are frequently provoked if she walks for long periods if she pushes past a early or minor amount of pain will have more symptoms that can take hours or a day to recover.  She gets relief when taking about 800 mg ibuprofen as needed for back pain exacerbations.  She had interval follow-up with her ophthalmology with no problems on hydroxychloroquine retinal toxicity screening.   Previous HPI 09/19/2021 Allison [name] is a 62 y.o. female here for follow up  for seropositive RA after starting hydroxychloroquine 400 mg daily and also some plantar fasciitis. She is doing well overall today. Morning stiffness is minimal and no flare ups or very swollen joints. She has noticed some increased low back and left hip pain this  gets worse after prolonged time walking. She occasionally has numbness or pain radiating down the leg or falling asleep but not all the time. She has not fallen or lost balance. She does not have increased hip pain lying on her side to sleep.   Previous HPI 02/07/2021 Allison [name] is a 62 y.o. female here for follow up for seropositive RA after starting hydroxychloroquine 400 mg daily and also some plantar fasciitis.  She feels symptoms are substantially improved with decreased swelling stiffness and improve range of motion in her hands.  She has changed footwear with recommendations from Dr. Katrinka Blazing also having improvement in  the plantar fasciitis.  She has not noticed any problems taking the medication.  She has not yet seen her scheduled ophthalmology exam she states she has an upcoming eye exam this winter.   Previous HPI 12/05/20 Allison [name] is a 62 y.o. female here for multiple joint pains including bilateral hands. She was also referred for NCS this did not demonstrate abnormalities consistent with her symptoms.  She has had joint pains to some degree for a while but particularly noticed increased pain and stiffness decreased range of motion and intermittent swelling in joints of several fingers during the past few months.  She is also noticed increase in bilateral pain on the bottoms of the feet that is worst first thing in the morning when weightbearing and improves walking throughout the day.   Labs reviewed 10/2020 ANA 1:40 speckled, cytoplasmic ACE 52 CBC unremarkable CMP wnl CRP wnl RF 268 CCP >250 TSH 0.19 Uric acid 3.9 Iron sat 7.8% PTH wnl   Review of Systems  Constitutional:  Positive for fatigue.  HENT:  Negative for mouth sores and mouth dryness.   Eyes:  Positive for dryness.  Respiratory:  Positive for shortness of breath.   Cardiovascular:  Positive for palpitations. Negative for chest pain.  Gastrointestinal:  Negative for blood in stool, constipation and diarrhea.  Endocrine: Negative for increased urination.  Genitourinary:  Positive for involuntary urination.  Musculoskeletal:  Positive for joint pain, joint pain, joint swelling, myalgias, muscle weakness, morning stiffness, muscle tenderness and myalgias. Negative for gait problem.  Skin:  Negative for color change, rash, hair loss and sensitivity to sunlight.  Allergic/Immunologic: Negative for susceptible to infections.  Neurological:  Negative for dizziness and headaches.  Hematological:  Negative for swollen glands.  Psychiatric/Behavioral:  Negative for depressed mood and sleep disturbance. The patient is not nervous/anxious.      PMFS History:  Patient Active Problem List   Diagnosis Date Noted   Pain in left knee 03/12/2023   COPD (chronic obstructive pulmonary disease) (HCC) 11/25/2022   Tobacco use 11/25/2022   Chronic cough 09/01/2022   Low back pain 03/06/2022   Rheumatoid arthritis with rheumatoid factor of multiple sites without organ or systems involvement (HCC) 12/05/2020   Iron deficiency 12/05/2020   Plantar fasciitis, bilateral 12/05/2020   High risk medication use 12/05/2020   Polyarthralgia 10/24/2020   Wrist pain 10/24/2020    Past Medical History:  Diagnosis Date   Anemia    Anxiety    COPD (chronic obstructive pulmonary disease) (HCC)    Depression    GERD (gastroesophageal reflux disease)    diet controlled , tums occasional   Hemorrhoids    Hyperlipidemia    Hypertension    Kidney stones    Rheumatoid arthritis (HCC)    Thyroid disease     Family History  Problem Relation Age of Onset   Asthma Mother  Heart disease Father    Osteoarthritis Sister    Colon polyps Brother    Colon polyps Brother    Heart disease Brother    Thyroid disease Daughter    Colon cancer Neg Hx    Rectal cancer Neg Hx    Stomach cancer Neg Hx    Esophageal cancer Neg Hx    Past Surgical History:  Procedure Laterality Date   APPENDECTOMY     arm surgery Right    nerve moved   BACK SURGERY     x 2 - lumbar - replaced 2 disc   CHOLECYSTECTOMY     COLONOSCOPY  07/2014   hx polyps [place], [place]   kidney stones     x 2 - removed by surgery   LAPAROTOMY     cyst removed from ovary   SHOULDER SURGERY Right    TUBAL LIGATION     WISDOM TOOTH EXTRACTION     Social History   Social History Narrative   Not on file   Immunization History  Administered Date(s) Administered   H1N1 05/28/2008   Hep A / Hep B 03/25/2007   Hepatitis B, ADULT 04/22/2007, 07/10/2014   IPV 03/25/2007   Influenza Inj Mdck Quad Pf 02/12/2018, 02/12/2019, 02/08/2021, 01/17/2022   Influenza Split  03/25/2007, 05/01/2014   Influenza,inj,Quad PF,6+ Mos 04/05/2017, 02/03/2020   Influenza-Unspecified 01/17/2022   Moderna Covid-19 Vaccine Bivalent Booster 37yrs & up 01/25/2021   Moderna SARS-COV2 Booster Vaccination 04/07/2020, 09/06/2020   Moderna Sars-Covid-2 Vaccination 08/30/2019, 09/27/2019   PNEUMOCOCCAL CONJUGATE-20 01/17/2022   Pfizer Covid-19 Vaccine Bivalent Booster 55yrs & up 09/20/2021   Pfizer(Comirnaty)Fall Seasonal Vaccine 12 years and older 02/14/2022   Pneumococcal Conjugate-13 07/10/2014   Respiratory Syncytial Virus Vaccine,Recomb Aduvanted(Arexvy) 03/28/2022   Td 12/28/2014   Tdap 02/04/2011, 10/25/2019   Typhoid Inactivated 03/25/2007   Zoster Recombinant(Shingrix) 02/18/2020, 05/04/2020     Objective: Vital Signs: BP 134/88 (BP Location: Left Arm, Patient Position: Sitting, Cuff Size: Normal)   Pulse 92   Resp 16   Ht 5\' 1"  (1.549 m)   Wt 182 lb (82.6 kg)   BMI 34.39 kg/m    Physical Exam Constitutional:      Appearance: She is obese.  HENT:     Mouth/Throat:     Mouth: Mucous membranes are moist.     Pharynx: Oropharynx is clear.  Eyes:     Conjunctiva/sclera: Conjunctivae normal.  Cardiovascular:     Rate and Rhythm: Normal rate and regular rhythm.  Pulmonary:     Effort: Pulmonary effort is normal.     Comments: Expiratory wheezes loudest over right lower lung field Musculoskeletal:     Right lower leg: No edema.     Left lower leg: No edema.  Lymphadenopathy:     Cervical: No cervical adenopathy.  Skin:    General: Skin is warm and dry.     Findings: No rash.  Neurological:     Mental Status: She is alert.  Psychiatric:        Mood and Affect: Mood normal.      Musculoskeletal Exam:  Shoulders full ROM no tenderness or swelling Elbows full ROM no tenderness or swelling Wrists full ROM no tenderness or swelling Fingers full ROM no tenderness or swelling Knees full ROM no tenderness or swelling, left knee patellofemoral  crepitus Ankles full ROM no tenderness or swelling   Investigation: No additional findings.  Imaging: No results found.  Recent Labs: Lab Results  Component Value Date  WBC 11.3 (H) 03/12/2023   HGB 15.0 03/12/2023   PLT 289 03/12/2023   NA 142 03/12/2023   K 4.2 03/12/2023   CL 105 03/12/2023   CO2 26 03/12/2023   GLUCOSE 84 03/12/2023   BUN 20 03/12/2023   CREATININE 0.87 03/12/2023   BILITOT 0.4 09/04/2022   ALKPHOS 89 10/24/2020   AST 19 09/04/2022   ALT 21 09/04/2022   PROT 6.9 09/04/2022   ALBUMIN 4.3 10/24/2020   CALCIUM 10.0 03/12/2023   QFTBGOLDPLUS NEGATIVE 12/05/2020    Speciality Comments: PLQ Eye Exam: 10/13/2022 WNL @ [facility] [place] Follow up in 1 year   Procedures:  No procedures performed Allergies: Ezetimibe, Latex, Penicillin g, and Sulfa antibiotics   Assessment / Plan:     Visit Diagnoses: Rheumatoid arthritis with rheumatoid factor of multiple sites without organ or systems involvement (HCC) - Plan: celecoxib (CELEBREX) 200 MG capsule, Sedimentation rate  Inflammatory arthritis appears well-controlled no peripheral synovitis appreciable on exam today.  Rechecking sedimentation rate for disease activity monitoring.  Plan to continue on hydroxychloroquine 400 mg daily.  The breakthrough joint symptoms appear more suggestive for osteoarthritis but could be having small flareups.  Recommend to switch from ibuprofen to Celebrex 200 mg for minimizing risk of GI toxicity but also continuing to limit the total frequency.  If severely abnormal inflammatory markers consider additional changes to maintenance DMARD versus short-term prednisone taper for exacerbation.  High risk medication use - hydroxychloroquine 400 mg daily. PLQ Eye Exam: 10/13/2022 WNL - Plan: CBC with Differential/Platelet, BASIC METABOLIC PANEL WITH GFR  Checking CBC and BMP for medication monitoring on continued long-term use of hydroxychloroquine and oral NSAIDs.  Most  recent eye exam from May with no evidence of retinal toxicity.  No serious interval infections.  Chronic left-sided low back pain with left-sided sciatica   Chronic pain of left knee - Plan: XR KNEE 3 VIEW LEFT  Knee pain with weightbearing especially in deep bending and patellofemoral crepitus present on exam.  This consistent with primary osteoarthritis but with pain coming and going for at least 6 months duration.  X-rays consistent with medial compartment osteoarthritis no other abnormal finding.   Orders: Orders Placed This Encounter  Procedures   XR KNEE 3 VIEW LEFT   Sedimentation rate   CBC with Differential/Platelet   BASIC METABOLIC PANEL WITH GFR   Meds ordered this encounter  Medications   celecoxib (CELEBREX) 200 MG capsule    Sig: Take 1 capsule (200 mg total) by mouth daily as needed.    Dispense:  30 capsule    Refill:  2     Follow-Up Instructions: Return in about 6 months (around 09/10/2023) for RA/OA on HCQ/NSAID f/u 6mos.   Fuller Plan, MD  Note - This record has been created using [employer].  Chart creation errors have been sought, but may not always  have been located. Such creation errors do not reflect on  the standard of medical care.

## 2023-03-01 NOTE — Telephone Encounter (Signed)
PT states she is SOB and "chocked up" once in awhile. Coughing has been much better but SOB on exertion.  PT seeks some kind of inhaler to use to open lungs up and a smoking cessation med. She mentioned the patch did not work well for her.     [facility] [place]

## 2023-03-02 NOTE — Telephone Encounter (Signed)
Called and spoke with the pt  She states has not had a cigarette in 5 days  She is weary of trying the patch to help cravings since she tried this years ago and it did not help at all  She is also asking to try inhaler  She has occ dry cough  Her breathing has improved since her last visit  She is still atking her zyrtec and nasacort  Please advise, thanks  Allergies  Allergen Reactions   Ezetimibe Other (See Comments)   Latex Itching   Penicillin G Hives   Sulfa Antibiotics Hives

## 2023-03-03 NOTE — Telephone Encounter (Signed)
Send prescription for albuterol HFA.  Ask her to use 2 puffs if needed for shortness of breath, cough, mucus clearance to see if she gets benefit.

## 2023-03-04 ENCOUNTER — Other Ambulatory Visit: Payer: Self-pay | Admitting: Emergency Medicine

## 2023-03-04 MED ORDER — ALBUTEROL SULFATE HFA 108 (90 BASE) MCG/ACT IN AERS: 2.0000 | INHALATION_SPRAY | RESPIRATORY_TRACT | 11 refills | Status: DC | PRN

## 2023-03-04 NOTE — Telephone Encounter (Signed)
Called and spoke with patient.  Dr. Kavin Leech recommendations given.  Understanding stated.  Albuterol HFA [facility] sent [facility] [place].  Nothing further at this time.

## 2023-03-12 ENCOUNTER — Ambulatory Visit: Payer: 59 | Attending: Internal Medicine | Admitting: Internal Medicine

## 2023-03-12 ENCOUNTER — Encounter: Payer: Self-pay | Admitting: Internal Medicine

## 2023-03-12 ENCOUNTER — Ambulatory Visit: Payer: 59

## 2023-03-12 VITALS — BP 134/88 | HR 92 | Resp 16 | Ht 61.0 in | Wt 182.0 lb

## 2023-03-12 DIAGNOSIS — G8929 Other chronic pain: Secondary | ICD-10-CM

## 2023-03-12 DIAGNOSIS — Z79899 Other long term (current) drug therapy: Secondary | ICD-10-CM | POA: Diagnosis not present

## 2023-03-12 DIAGNOSIS — M0579 Rheumatoid arthritis with rheumatoid factor of multiple sites without organ or systems involvement: Secondary | ICD-10-CM

## 2023-03-12 DIAGNOSIS — M25562 Pain in left knee: Secondary | ICD-10-CM | POA: Diagnosis not present

## 2023-03-12 DIAGNOSIS — M5442 Lumbago with sciatica, left side: Secondary | ICD-10-CM

## 2023-03-12 MED ORDER — CELECOXIB 200 MG PO CAPS: 200.0000 mg | ORAL_CAPSULE | Freq: Every day | ORAL | 2 refills | Status: DC | PRN

## 2023-03-12 NOTE — Patient Instructions (Signed)

## 2023-03-13 LAB — BASIC METABOLIC PANEL WITH GFR
BUN: 20 mg/dL (ref 7–25)
CO2: 26 mmol/L (ref 20–32)
Calcium: 10 mg/dL (ref 8.6–10.4)
Chloride: 105 mmol/L (ref 98–110)
Creat: 0.87 mg/dL (ref 0.50–1.05)
Glucose, Bld: 84 mg/dL (ref 65–99)
Potassium: 4.2 mmol/L (ref 3.5–5.3)
Sodium: 142 mmol/L (ref 135–146)
eGFR: 74 mL/min/{1.73_m2} (ref >=60)

## 2023-03-13 LAB — CBC WITH DIFFERENTIAL/PLATELET
Absolute Lymphocytes: 3401 {cells}/uL (ref 850–3900)
Absolute Monocytes: 655 {cells}/uL (ref 200–950)
Basophils Absolute: 113 {cells}/uL (ref 0–200)
Basophils Relative: 1 %
Eosinophils Absolute: 215 {cells}/uL (ref 15–500)
Eosinophils Relative: 1.9 %
HCT: 46.3 % — ABNORMAL HIGH (ref 35.0–45.0)
Hemoglobin: 15 g/dL (ref 11.7–15.5)
MCH: 27.3 pg (ref 27.0–33.0)
MCHC: 32.4 g/dL (ref 32.0–36.0)
MCV: 84.3 fL (ref 80.0–100.0)
MPV: 10.3 fL (ref 7.5–12.5)
Monocytes Relative: 5.8 %
Neutro Abs: 6916 {cells}/uL (ref 1500–7800)
Neutrophils Relative %: 61.2 %
Platelets: 289 10*3/uL (ref 140–400)
RBC: 5.49 10*6/uL — ABNORMAL HIGH (ref 3.80–5.10)
RDW: 12.6 % (ref 11.0–15.0)
Total Lymphocyte: 30.1 %
WBC: 11.3 10*3/uL — ABNORMAL HIGH (ref 3.8–10.8)

## 2023-03-13 LAB — SEDIMENTATION RATE: Sed Rate: 9 mm/h (ref 0–30)

## 2023-03-15 IMAGING — US US THYROID
1 series · 13 of 25 positions shown · non-contrast
Comparison: 07/12/2017

CLINICAL DATA: Right thyroid nodule

EXAM:
THYROID ULTRASOUND
TECHNIQUE: Ultrasound examination of the thyroid gland and adjacent soft
tissues was performed.

[Series 1: us thyroid · 0.07mm/px · 13 of 58 slices shown]
[im 1/58]
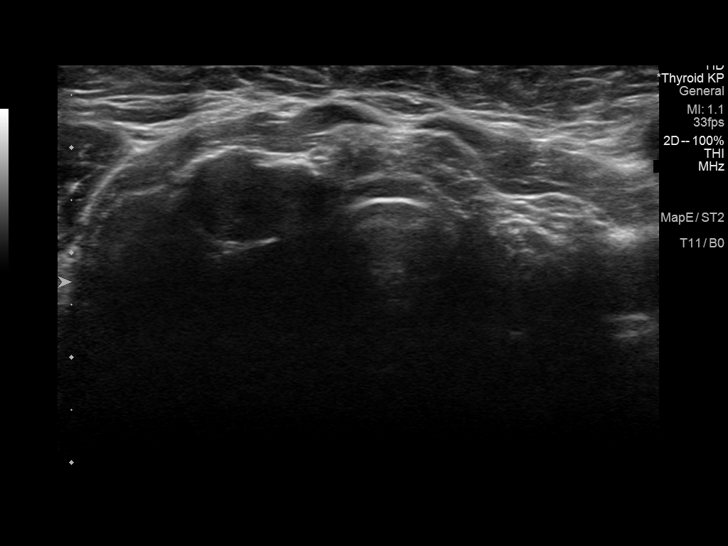
[im 5/58]
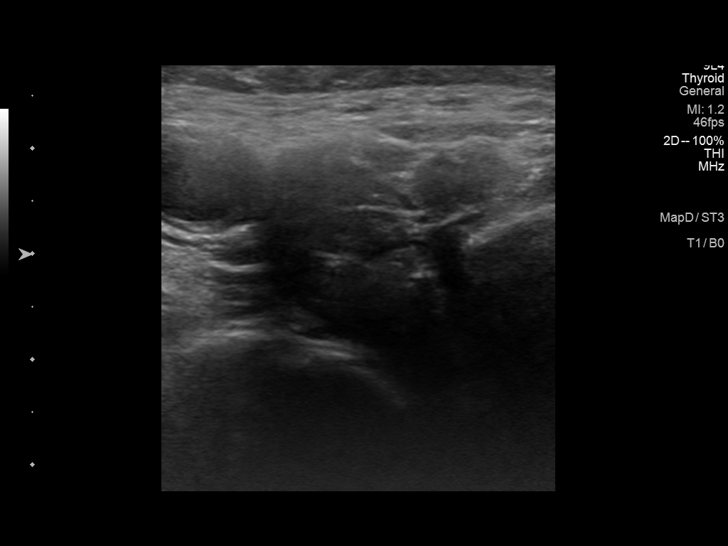
[im 10/58]
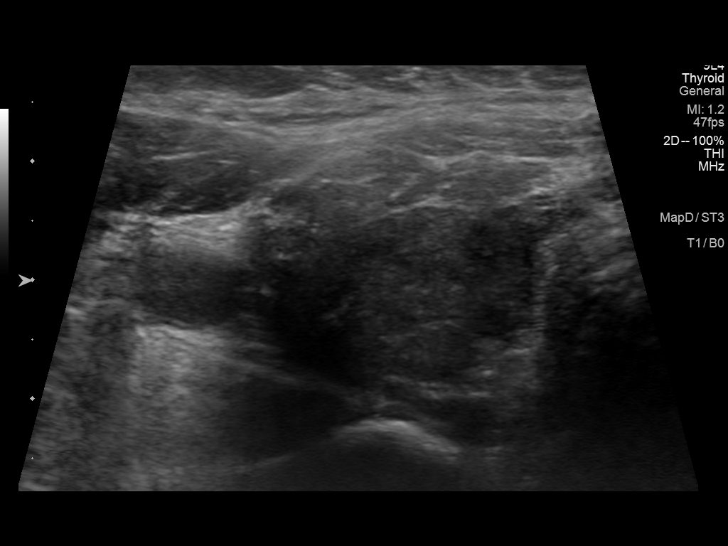
[im 15/58]
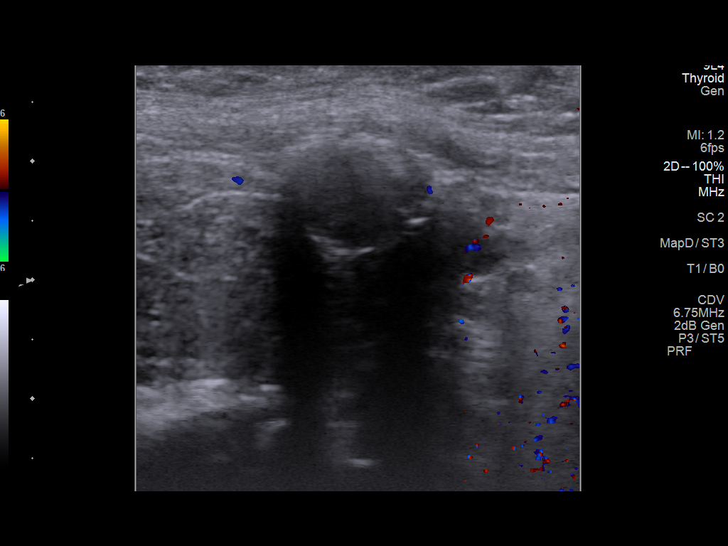
[im 20/58]
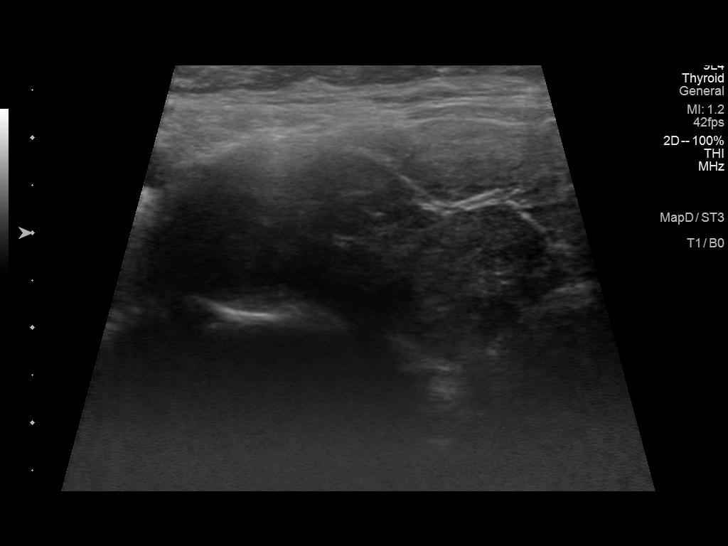
[im 24/58]
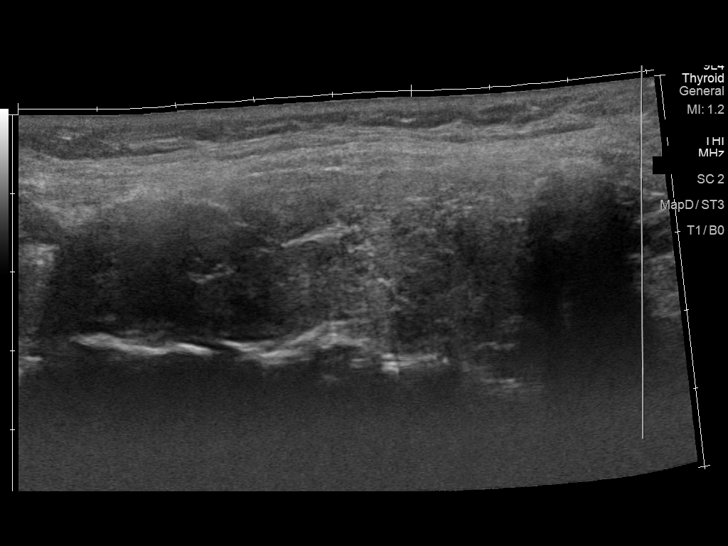
[im 29/58]
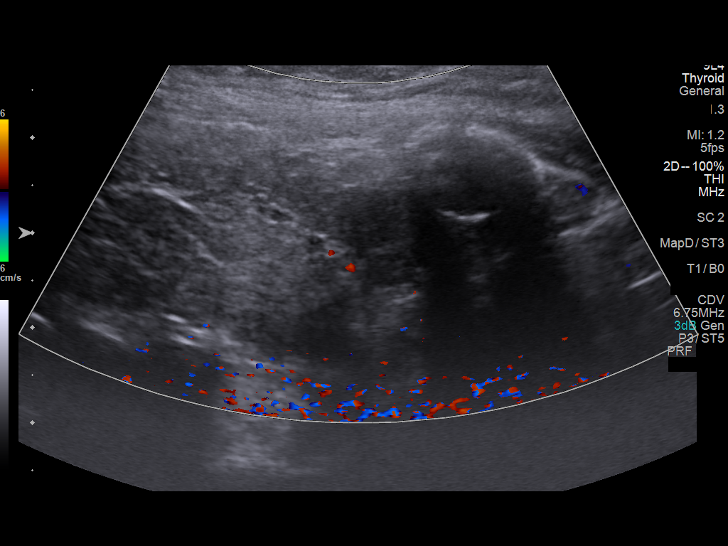
[im 34/58]
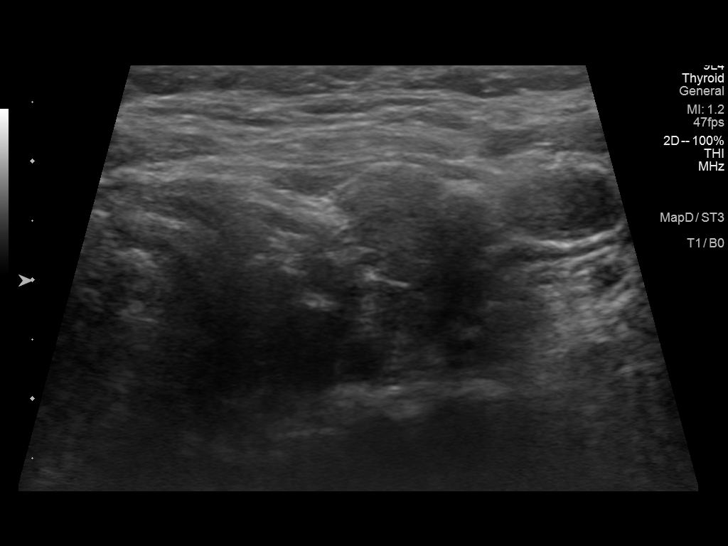
[im 39/58]
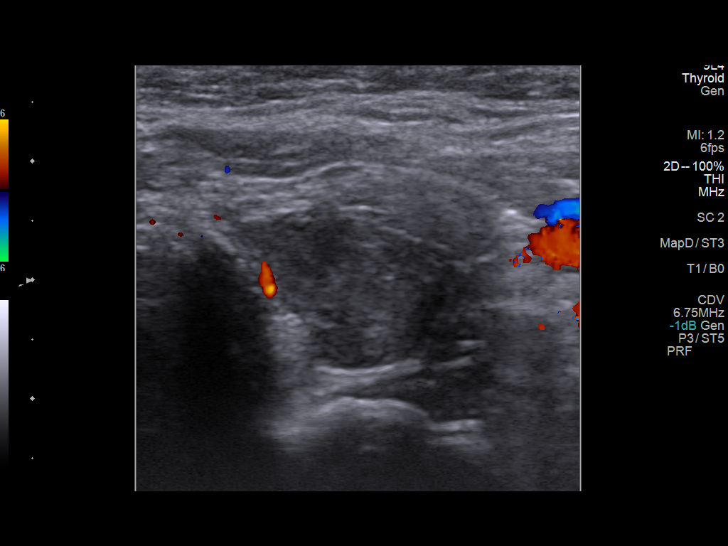
[im 43/58]
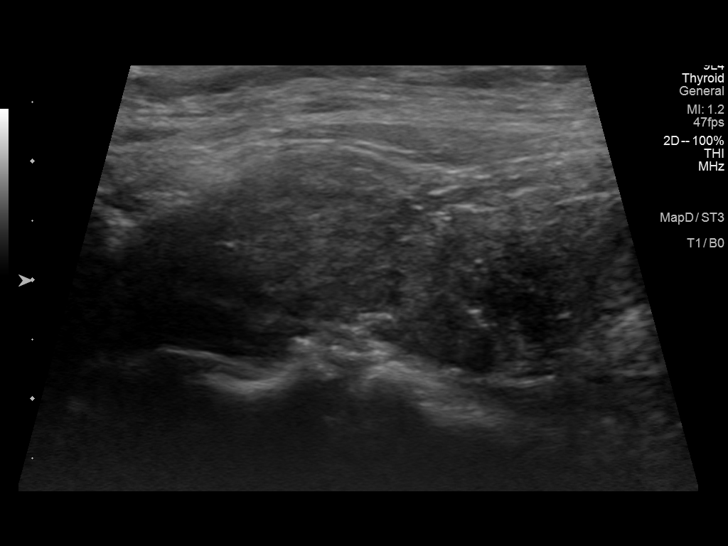
[im 48/58]
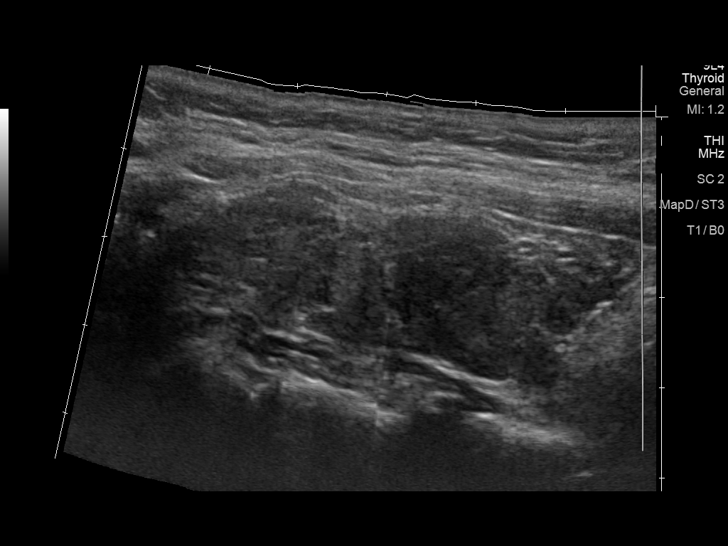
[im 53/58]
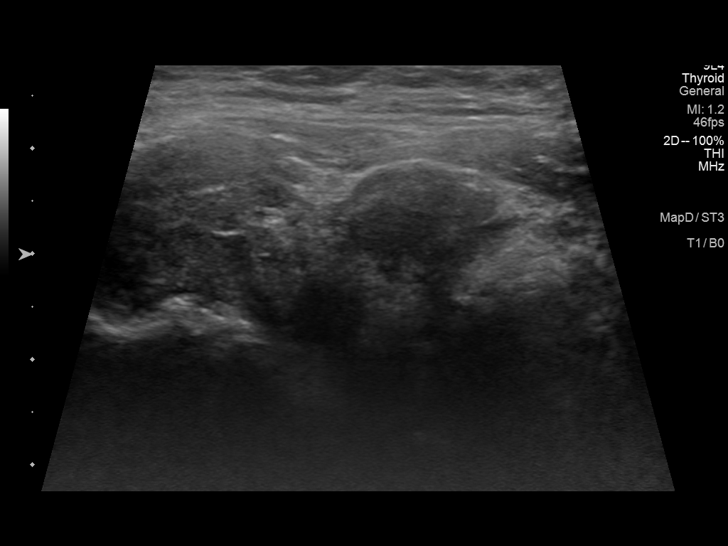
[im 58/58]
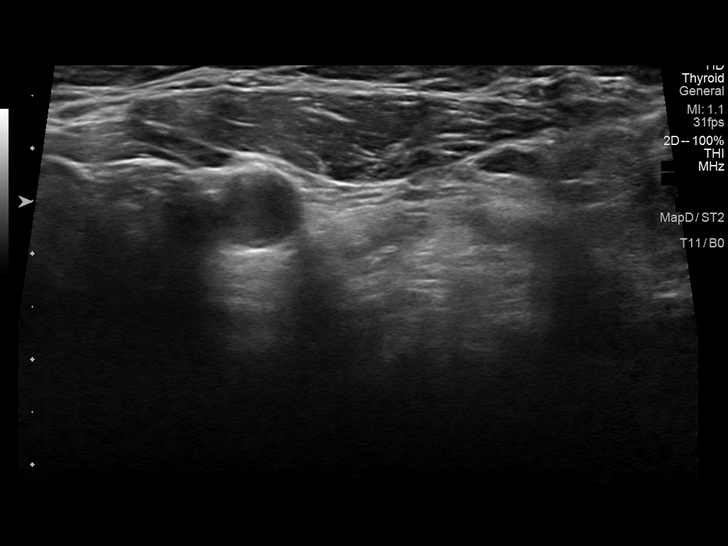

[13 of 25 positions shown; findings below may reference images not displayed]

FINDINGS: Parenchymal Echotexture: Moderately heterogenous

Isthmus: 5 mm

Right lobe: 7.6 x 2.4 x 2.4 cm

Left lobe: 5.6 x 1.8 x 2.4 cm

_________________________________________________________

Estimated total number of nodules >/= 1 cm: 1

Number of spongiform nodules >/=  2 cm not described below (TR1): 0

Number of mixed cystic and solid nodules >/= 1.5 cm not described
below (TR2): 0

_________________________________________________________

Nodule # 1:

Location: Right; Inferior

Maximum size: 1.5, previously 1.7 cm; Other 2 dimensions: 1.3 x
cm

Composition: solid/almost completely solid (2)

Echogenicity: isoechoic (1)

Shape: not taller-than-wide (0)

Margins: ill-defined (0)

Echogenic foci: peripheral calcifications (2)

ACR TI-RADS total points: 5.

ACR TI-RADS risk category: TR4 (4-6 points).

ACR TI-RADS recommendations:

**Given size (>/= 1.5 cm) and appearance, fine needle aspiration of
this moderately suspicious nodule should be considered based on
TI-RADS criteria. However, stability dating back to 07/12/2017
supports a benign process. Therefore continued follow-up could also
be performed.

_________________________________________________________

Stable thyroid heterogeneity and mild enlargement. No
hypervascularity. No regional adenopathy.
IMPRESSION: Stable 1.5 cm right inferior thyroid TR 4 nodule dating back to
4035. See above comment.

No new thyroid abnormality.

The above is in keeping with the ACR TI-RADS recommendations - [HOSPITAL] 1374;[DATE].

## 2023-03-28 IMAGING — US US FNA BIOPSY THYROID 1ST LESION
1 series · 12 of 12 positions shown · non-contrast
Comparison: US Thyroid 09/19/21.

MEDICATIONS:
1% lidocaine

10 cc

COMPLICATIONS:
None immediate.

INDICATION: Right inferior thyroid nodule

1.5 cm
EXAM:
ULTRASOUND GUIDED FINE NEEDLE ASPIRATION OF INDETERMINATE THYROID
NODULE
TECHNIQUE: Informed written consent was obtained from the patient after a
discussion of the risks, benefits and alternatives to treatment.
Questions regarding the procedure were encouraged and answered. A
timeout was performed prior to the initiation of the procedure.

[Series 1: us fna biopsy thyroid 1st lesion · 0.05mm/px · 12 acquisitions, 12 frames shown]
[im 1/12]
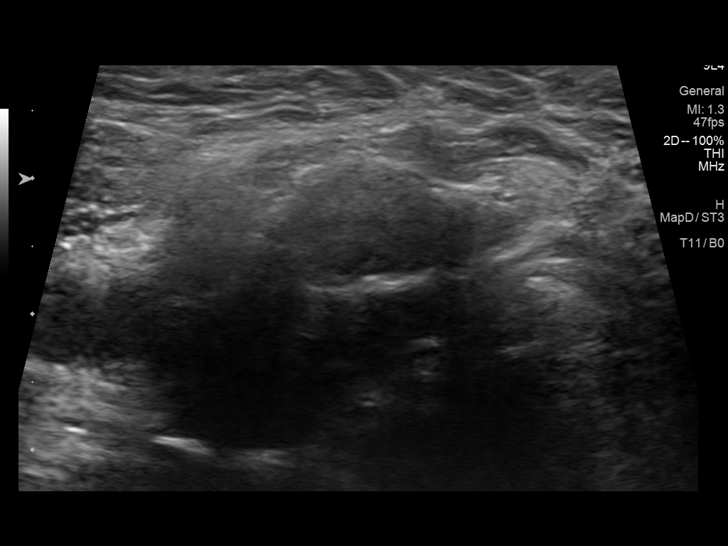
[im 2/12]
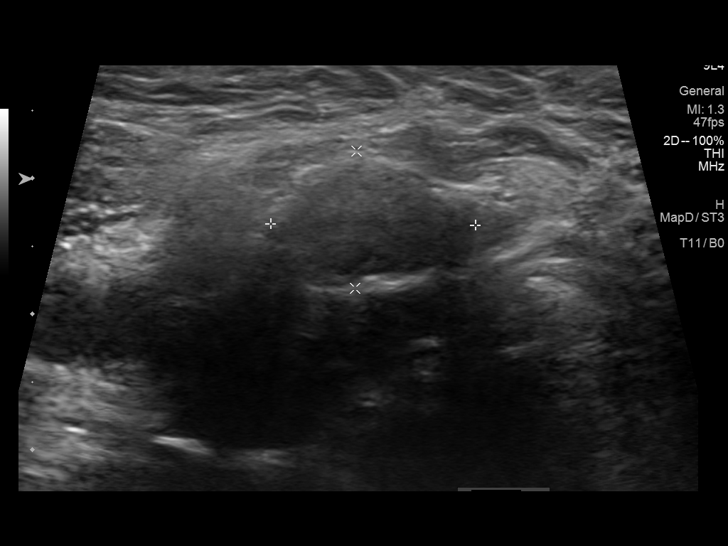
[im 3/12]
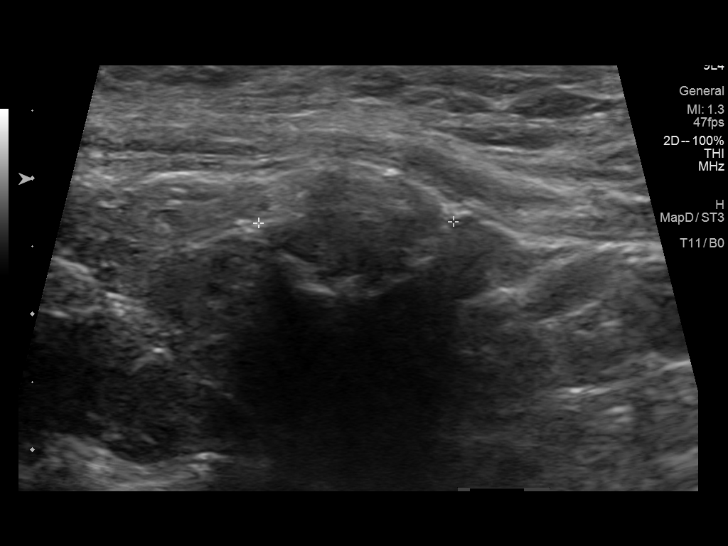
[im 4/12]
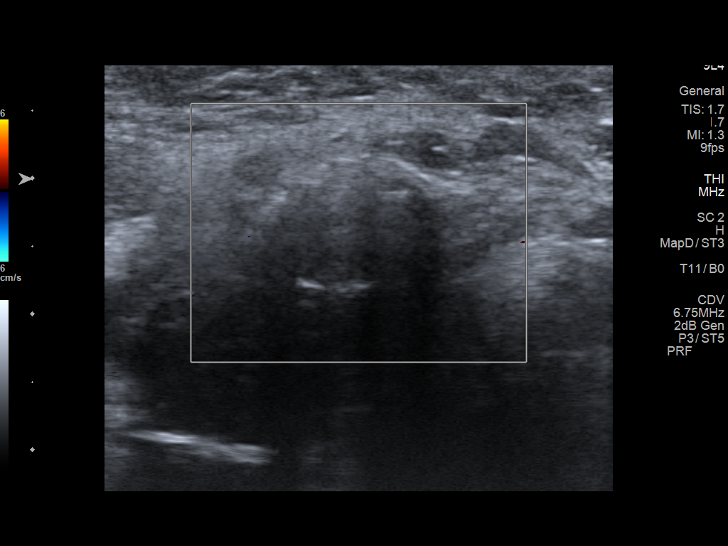
[im 5/12]
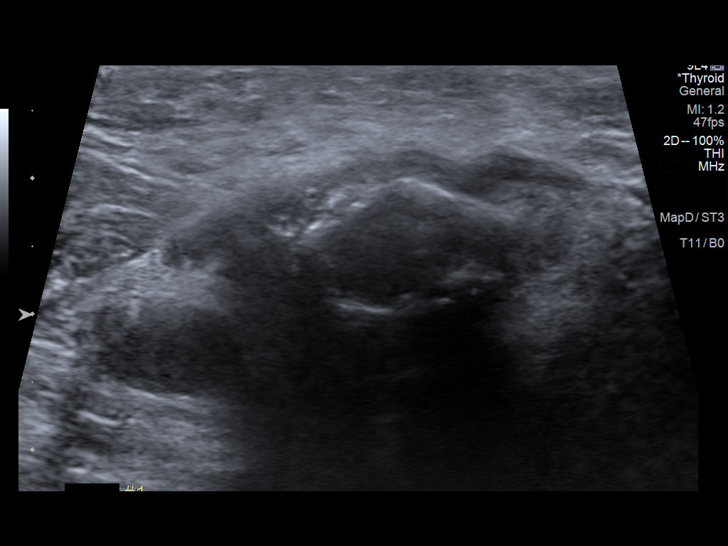
[im 6/12]
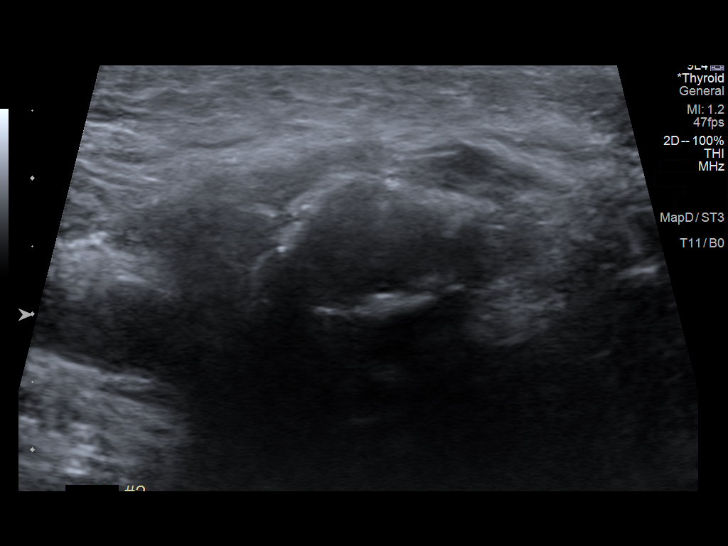
[im 7/12]
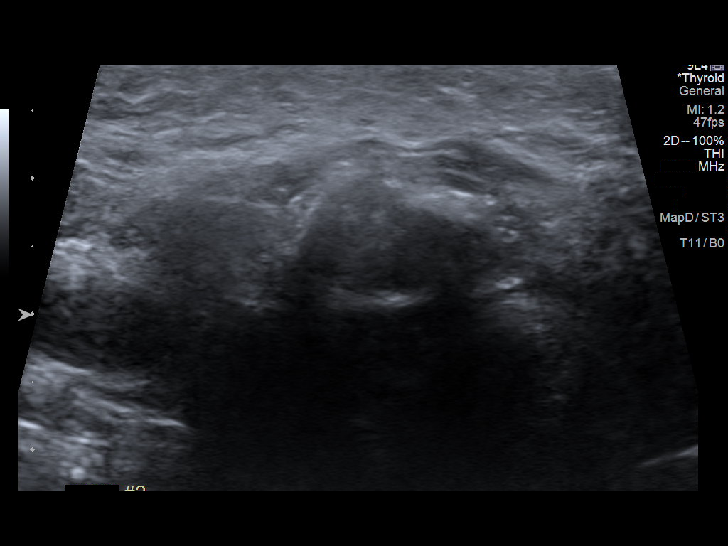
[im 8/12]
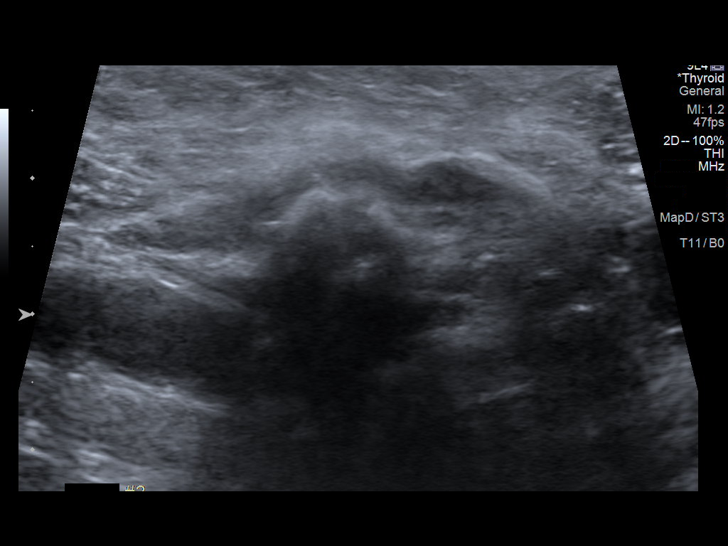
[im 9/12]
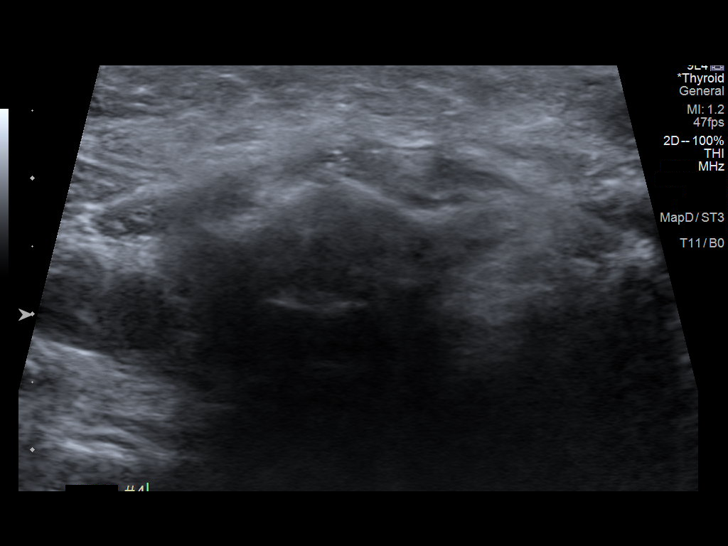
[im 10/12]
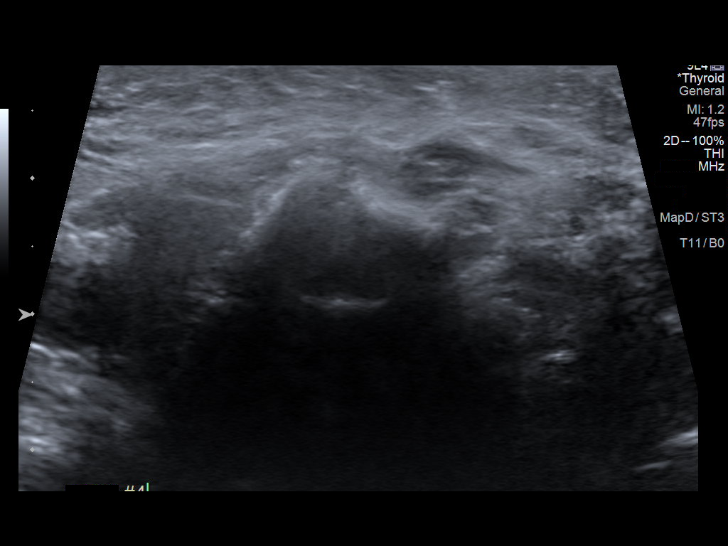
[im 11/12]
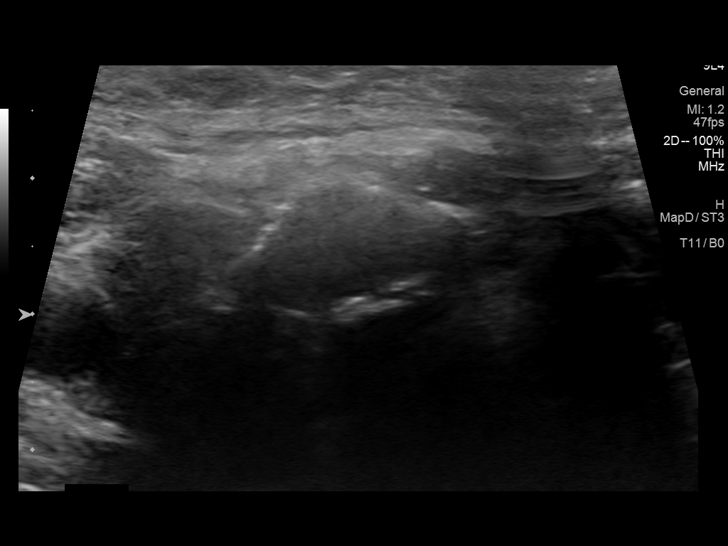
[im 12/12]
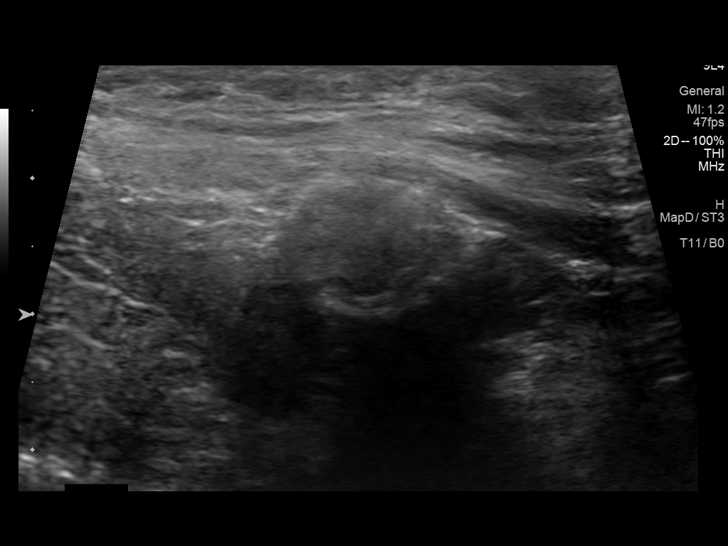

[12 of 12 positions shown; findings below may reference images not displayed]

Pre-procedural ultrasound scanning demonstrated unchanged size and
appearance of the indeterminate nodule within the right thyroid

The procedure was planned. The neck was prepped in the usual sterile
fashion, and a sterile drape was applied covering the operative
field. A timeout was performed prior to the initiation of the
procedure. Local anesthesia was provided with 1% lidocaine.

Under direct ultrasound guidance, 3 FNA biopsies were performed of
the right inferior thyroid nodule with a 27 gauge needle.

Pt developed feeling of sore throat and constriction sensation.
Stopped biopsy secondary these symptoms.

Biopsy was difficult- hard rim of calcium surrounding nodule.

Multiple ultrasound images were saved for procedural documentation
purposes. The samples were prepared and submitted to pathology.

Limited post procedural scanning was negative for hematoma or
additional complication. Dressings were placed. The patient
tolerated the above procedures procedure well without immediate
postprocedural complication.
FINDINGS: Nodule reference number based on prior diagnostic ultrasound: 1

Maximum size: 1.5 cm

Location: Right; Inferior

ACR TI-RADS risk category: TR4 (4-6 points)

Reason for biopsy: meets ACR TI-RADS criteria

Ultrasound imaging confirms appropriate placement of the needles
within the thyroid nodule.
IMPRESSION: Technically successful ultrasound guided fine needle aspiration of
right inferior thyroid nodule.

Biopsy was aborted after 3 passes with 27 gauge needle. Pt was
experiencing sore throat and constriction sensation.

All symptoms did resolve quickly.

Sterile field was compromised tending to pt and pt needs and
decision was made to stop at this point.

Await results and go from there.

Pt is in agreement with this plan.

She is aware biopsy may have to be repeated--- or at least AFIRMA
samples taken.

Read by

Aliumer Bartella

## 2023-03-29 ENCOUNTER — Telehealth: Payer: Self-pay | Admitting: *Deleted

## 2023-03-29 ENCOUNTER — Other Ambulatory Visit: Payer: Self-pay | Admitting: *Deleted

## 2023-03-29 DIAGNOSIS — E611 Iron deficiency: Secondary | ICD-10-CM

## 2023-03-29 NOTE — Telephone Encounter (Signed)
Called patient to inform her labs needed to be drawn. Patient understood and agreed.

## 2023-03-29 NOTE — Telephone Encounter (Signed)
-----   Message from Benancio Deeds sent at 03/29/2023 12:45 PM EST ----- Regarding: RE: time for lab draws Yes she did not have the TIBC / ferritin done, so she will need to just come in for that. Thanks ----- Message ----- From: Avanell Shackleton, RN Sent: 03/29/2023  11:18 AM EST To: Benancio Deeds, MD Subject: time for lab draws                             Patient recently had CBC done on 03/13/23, do you want to repeat the CBC and the ordered IBC panel, or IBCF ordered? ----- Message ----- From: Avanell Shackleton, RN Sent: 03/26/2023  12:00 AM EST To: Missy Sabins, RN  Please contact patient for labs. Orders already entered.

## 2023-04-02 ENCOUNTER — Other Ambulatory Visit: Payer: 59

## 2023-04-02 DIAGNOSIS — E611 Iron deficiency: Secondary | ICD-10-CM

## 2023-04-03 LAB — IRON,TIBC AND FERRITIN PANEL
%SAT: 17 % (ref 16–45)
Ferritin: 246 ng/mL (ref 16–288)
Iron: 49 ug/dL (ref 45–160)
TIBC: 283 ug/dL (ref 250–450)

## 2023-04-17 ENCOUNTER — Other Ambulatory Visit: Payer: Self-pay | Admitting: Internal Medicine

## 2023-04-17 DIAGNOSIS — M0579 Rheumatoid arthritis with rheumatoid factor of multiple sites without organ or systems involvement: Secondary | ICD-10-CM

## 2023-04-19 NOTE — Telephone Encounter (Signed)
Last Fill: 09/04/2022  Eye exam: 10/13/2022   Labs: 03/12/2023 WBC 11.3, RBC 5.49, HCT 46.3,   Next Visit: 09/10/2023  Last Visit: 03/12/2023  [id number] arthritis with rheumatoid factor of multiple sites without organ or systems involvement   Current Dose per office note 03/12/2023: hydroxychloroquine 400 mg daily.   Okay to refill Plaquenil?

## 2023-04-20 NOTE — Progress Notes (Signed)
White blood cells slightly increased at 11.3. Sedimentation rate remains normal and kidney function remains normal. So I don't think we need to change her long term medication right now. If joint pain is still increased I could prescribe her a short course of prednisone for it.

## 2023-05-22 ENCOUNTER — Other Ambulatory Visit: Payer: Self-pay | Admitting: Internal Medicine

## 2023-05-22 DIAGNOSIS — M0579 Rheumatoid arthritis with rheumatoid factor of multiple sites without organ or systems involvement: Secondary | ICD-10-CM

## 2023-05-24 NOTE — Telephone Encounter (Signed)
 Last Fill: 03/12/2023  Labs: 03/12/2023 White blood cells slightly increased at 11.3. Sedimentation rate remains normal and kidney function remains normal. So I don't think we need to change her long term medication right now. If joint pain is still increased I could prescribe her a short course of prednisone  for it.  Next Visit: 09/10/2023  Last Visit: 03/12/2023  DX: Rheumatoid arthritis with rheumatoid factor of multiple sites without organ or systems involvement   Current Dose per office note 03/12/2023: switch from ibuprofen  to Celebrex  200 mg   Okay to refill Celebrex ?

## 2023-07-19 ENCOUNTER — Telehealth: Payer: Self-pay

## 2023-07-19 DIAGNOSIS — D509 Iron deficiency anemia, unspecified: Secondary | ICD-10-CM

## 2023-07-19 NOTE — Telephone Encounter (Signed)
 Orders entered.  MyChart message to patient to go to the lab in the next couple of weeks

## 2023-07-19 NOTE — Telephone Encounter (Signed)
-----   Message from [facility] Marylu Lund H sent at 04/06/2023  2:52 PM EST ----- Regarding: labs due in march Patient due for CBC and TIBC / ferritin in mid March

## 2023-07-23 ENCOUNTER — Other Ambulatory Visit

## 2023-07-23 DIAGNOSIS — D509 Iron deficiency anemia, unspecified: Secondary | ICD-10-CM | POA: Diagnosis not present

## 2023-07-23 LAB — CBC WITH DIFFERENTIAL/PLATELET
Basophils Absolute: 0.1 10*3/uL (ref 0.0–0.1)
Basophils Relative: 1.1 % (ref 0.0–3.0)
Eosinophils Absolute: 0.5 10*3/uL (ref 0.0–0.7)
Eosinophils Relative: 4.9 % (ref 0.0–5.0)
HCT: 44.2 % (ref 36.0–46.0)
Hemoglobin: 14.8 g/dL (ref 12.0–15.0)
Lymphocytes Relative: 31 % (ref 12.0–46.0)
Lymphs Abs: 3.1 10*3/uL (ref 0.7–4.0)
MCHC: 33.4 g/dL (ref 30.0–36.0)
MCV: 86.9 fl (ref 78.0–100.0)
Monocytes Absolute: 0.7 10*3/uL (ref 0.1–1.0)
Monocytes Relative: 6.8 % (ref 3.0–12.0)
Neutro Abs: 5.7 10*3/uL (ref 1.4–7.7)
Neutrophils Relative %: 56.2 % (ref 43.0–77.0)
Platelets: 227 10*3/uL (ref 150.0–400.0)
RBC: 5.08 Mil/uL (ref 3.87–5.11)
RDW: 13 % (ref 11.5–15.5)
WBC: 10 10*3/uL (ref 4.0–10.5)

## 2023-07-23 LAB — IBC + FERRITIN
Ferritin: 282.1 ng/mL (ref 10.0–291.0)
Iron: 39 ug/dL — ABNORMAL LOW (ref 42–145)
Saturation Ratios: 14.1 % — ABNORMAL LOW (ref 20.0–50.0)
TIBC: 275.8 ug/dL (ref 250.0–450.0)
Transferrin: 197 mg/dL — ABNORMAL LOW (ref 212.0–360.0)

## 2023-08-14 ENCOUNTER — Other Ambulatory Visit: Payer: Self-pay | Admitting: Internal Medicine

## 2023-08-14 DIAGNOSIS — M0579 Rheumatoid arthritis with rheumatoid factor of multiple sites without organ or systems involvement: Secondary | ICD-10-CM

## 2023-08-16 ENCOUNTER — Other Ambulatory Visit: Payer: Self-pay

## 2023-08-16 ENCOUNTER — Other Ambulatory Visit: Payer: Self-pay | Admitting: Internal Medicine

## 2023-08-16 DIAGNOSIS — G8929 Other chronic pain: Secondary | ICD-10-CM

## 2023-08-16 DIAGNOSIS — M0579 Rheumatoid arthritis with rheumatoid factor of multiple sites without organ or systems involvement: Secondary | ICD-10-CM

## 2023-08-16 MED ORDER — CELECOXIB 200 MG PO CAPS: 200.0000 mg | ORAL_CAPSULE | Freq: Every day | ORAL | 0 refills | Status: DC | PRN

## 2023-08-16 NOTE — Telephone Encounter (Signed)
 Last Fill: 05/24/2023  Labs: 07/23/2023 CBC WNL  03/12/2023 BMP WNL  Next Visit: 09/10/2023  Last Visit: 03/12/2023  DX: Rheumatoid arthritis with rheumatoid factor of multiple sites without organ or systems involvement (HCC)   Current Dose per office note 03/12/2023: Recommend to switch from ibuprofen to Celebrex 200 mg for minimizing risk of GI toxicity but also continuing to limit the total frequency.  Okay to refill Celebrex?

## 2023-08-22 ENCOUNTER — Other Ambulatory Visit: Payer: Self-pay | Admitting: Emergency Medicine

## 2023-08-26 ENCOUNTER — Encounter: Payer: Self-pay | Admitting: Internal Medicine

## 2023-08-26 ENCOUNTER — Other Ambulatory Visit: Payer: Self-pay | Admitting: Gastroenterology

## 2023-08-27 NOTE — Telephone Encounter (Signed)
 Contacted the patient and advised Dr. Dimple Casey sent in a three month supply of hydroxychloroquine on 04/20/2023 with 1 refill. Advised the patient she should be good on medication until June. Patient states she will contact her pharmacy.

## 2023-09-01 NOTE — Progress Notes (Signed)
 Office Visit Note  Patient: Allison [name]             Date of Birth: [birth year]           MRN: [id number]             PCP: Laurelyn Ponder, FNP Referring: Laurelyn Ponder, FNP Visit Date: 09/10/2023   Subjective:  Follow-up (Patient states she is having a little bit of a flare in the right hand. )   Discussed the use of AI scribe software for clinical note transcription with the patient, who gave verbal consent to proceed.  History of Present Illness   Allison [name] is a 63 y.o. 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    She experiences a flare-up of pain in her right hand, specifically in the thumb area, which began earlier this week. The pain is described as 'just a little bit sore' and is possibly related to stress or excessive use of the computer mouse. She manages the pain with a supportive brace and over-the-counter medications as needed.  She has a history of anemia and has been taking iron supplements for a couple of years. A recent follow-up in the GI clinic noted low iron levels but normal binding capacity. She does not experience significant side effects from the iron supplements, occasionally using a stool softener to prevent constipation.  She mentions a past incident where she fell on a sand dune, resulting in significant pain in her abdomen and trunk. X-rays were performed to rule out fractures. The pain lasted about two weeks, and she still experiences occasional soreness.  She has osteoarthritis in her knees but reports minimal trouble recently. She uses acetaminophen and occasionally Celebrex  for pain management, particularly for her back pain. She avoids ibuprofen  due to previous advice.  She experiences chronic back pain, exacerbated by prolonged standing or walking. The pain starts in the lower back and sometimes radiates down the leg. She has had a hip replacement and notes that the pain is more pronounced  on the left side. She does not engage in regular exercise or stretching, which she acknowledges as a contributing factor to her discomfort.  No significant congestion or drainage despite the pollen season and only occasional morning cough. No recent illness.       Previous HPI 03/12/2023 Allison [name] is a 63 y.o. 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  Overall joint inflammation has been doing well still has frequent days with increased joint pain most commonly affecting her right fifth finger and at the left knee.  Often gets acute knee pain with weightbearing climbing stairs.  She finds using a knee brace at the start of increased pain is very helpful otherwise has had episodes where she developed visible swelling.  She takes ibuprofen  800 mg as needed estimates this is a few days per week.  She had upper and lower endoscopy in August with evidence of gastritis so was recommended to limit NSAID exposure.  Also with some polyps no other specific cause for her iron deficiency identified on the study.   Previous HPI 09/04/2022 Allison [name] is a 63 y.o. 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  Overall she been doing well from a joint standpoint though in the past few weeks has experienced increased pain and stiffness especially at her left second third MCP joints.  Sometimes  swelling throughout the entire third digit.  This is varied in intensity from day-to-day usually when it is worse she gets some relief from the ibuprofen  800 mg as needed.  Also has problem with ongoing nonproductive cough for the past few months.  Has had previous episodes of prolonged chronic cough usually provoked by some type of illness she had URI symptoms back in December.  Just had recent chest x-ray after follow-up in pulmonology clinic and switching from lisinopril to valsartan .    Previous HPI 03/06/22 Allison [name] is a 63 y.o. 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  Her inflammatory joint symptoms have been well improved she had one episode of increased pain in her hands he associated with adjustment to her home office desk and working computer configuration.  Besides this her main issue is pain at the low back.  This is most significant at the midline and left side occasional radiation down the left leg.  Symptoms are frequently provoked if she walks for long periods if she pushes past a early or minor amount of pain will have more symptoms that can take hours or a day to recover.  She gets relief when taking about 800 mg ibuprofen  as needed for back pain exacerbations.  She had interval follow-up with her ophthalmology with no problems on hydroxychloroquine  retinal toxicity screening.   Previous HPI 09/19/2021 Allison [name] is a 63 y.o. female here for follow up  for seropositive RA after starting hydroxychloroquine  400 mg daily and also some plantar fasciitis. She is doing well overall today. Morning stiffness is minimal and no flare ups or very swollen joints. She has noticed some increased low back and left hip pain this gets worse after prolonged time walking. She occasionally has numbness or pain radiating down the leg or falling asleep but not all the time. She has not fallen or lost balance. She does not have increased hip pain lying on her side to sleep.   Previous HPI 02/07/2021 Allison [name] is a 63 y.o. female here for follow up for seropositive RA after starting hydroxychloroquine  400 mg daily and also some plantar fasciitis.  She feels symptoms are substantially improved with decreased swelling stiffness and improve range of motion in her hands.  She has changed footwear with recommendations from Dr. Felipe Horton also having improvement in the plantar fasciitis.  She has not noticed any problems taking the medication.  She has not yet seen her scheduled ophthalmology exam she states she has an upcoming eye  exam this winter.   Previous HPI 12/05/20 Allison [name] is a 63 y.o. female here for multiple joint pains including bilateral hands. She was also referred for NCS this did not demonstrate abnormalities consistent with her symptoms.  She has had joint pains to some degree for a while but particularly noticed increased pain and stiffness decreased range of motion and intermittent swelling in joints of several fingers during the past few months.  She is also noticed increase in bilateral pain on the bottoms of the feet that is worst first thing in the morning when weightbearing and improves walking throughout the day.   Labs reviewed 10/2020 ANA 1:40 speckled, cytoplasmic ACE 52 CBC unremarkable CMP wnl CRP wnl RF 268 CCP >250 TSH 0.19 Uric acid 3.9 Iron sat 7.8% PTH wnl   Review of Systems  Constitutional:  Negative for fatigue.  HENT:  Positive for mouth dryness. Negative for mouth sores.   Eyes:  Negative for  dryness.  Respiratory:  Positive for shortness of breath.   Cardiovascular:  Negative for chest pain and palpitations.  Gastrointestinal:  Negative for blood in stool, constipation and diarrhea.  Endocrine: Positive for increased urination.  Genitourinary:  Positive for involuntary urination.  Musculoskeletal:  Positive for joint pain, joint pain, joint swelling, muscle weakness, morning stiffness and muscle tenderness. Negative for gait problem, myalgias and myalgias.  Skin:  Negative for color change, rash, hair loss and sensitivity to sunlight.  Allergic/Immunologic: Negative for susceptible to infections.  Neurological:  Negative for dizziness and headaches.  Hematological:  Negative for swollen glands.  Psychiatric/Behavioral:  Negative for depressed mood and sleep disturbance. The patient is not nervous/anxious.     PMFS History:  Patient Active Problem List   Diagnosis Date Noted   Pain in left knee 03/12/2023   COPD (chronic obstructive pulmonary disease) (HCC)  11/25/2022   Tobacco use 11/25/2022   Chronic cough 09/01/2022   Low back pain 03/06/2022   Rheumatoid arthritis with rheumatoid factor of multiple sites without organ or systems involvement (HCC) 12/05/2020   Iron deficiency 12/05/2020   Plantar fasciitis, bilateral 12/05/2020   High risk medication use 12/05/2020   Polyarthralgia 10/24/2020   Wrist pain 10/24/2020    Past Medical History:  Diagnosis Date   Anemia    Anxiety    COPD (chronic obstructive pulmonary disease) (HCC)    Depression    GERD (gastroesophageal reflux disease)    diet controlled , tums occasional   Hemorrhoids    Hyperlipidemia    Hypertension    Kidney stones    Rheumatoid arthritis (HCC)    Thyroid  disease     Family History  Problem Relation Age of Onset   Asthma Mother    Heart disease Father    Osteoarthritis Sister    Colon polyps Brother    Colon polyps Brother    Heart disease Brother    Thyroid  disease Daughter    Colon cancer Neg Hx    Rectal cancer Neg Hx    Stomach cancer Neg Hx    Esophageal cancer Neg Hx    Past Surgical History:  Procedure Laterality Date   APPENDECTOMY     arm surgery Right    nerve moved   BACK SURGERY     x 2 - lumbar - replaced 2 disc   CHOLECYSTECTOMY     COLONOSCOPY  07/2014   hx polyps [place]   kidney stones     x 2 - removed by surgery   LAPAROTOMY     cyst removed from ovary   SHOULDER SURGERY Right    TUBAL LIGATION     WISDOM TOOTH EXTRACTION     Social History   Social History Narrative   Not on file   Immunization History  Administered Date(s) Administered   H1N1 05/28/2008   Hep A / Hep B 03/25/2007   Hepatitis B, ADULT 04/22/2007, 07/10/2014   IPV 03/25/2007   Influenza Inj Mdck Quad Pf 02/12/2018, 02/12/2019, 02/08/2021, 01/17/2022   Influenza Split 03/25/2007, 05/01/2014   Influenza,inj,Quad PF,6+ Mos 04/05/2017, 02/03/2020   Influenza-Unspecified 01/17/2022   Moderna Covid-19 Vaccine Bivalent Booster 60yrs & up  01/25/2021   Moderna SARS-COV2 Booster Vaccination 04/07/2020, 09/06/2020   Moderna Sars-Covid-2 Vaccination 08/30/2019, 09/27/2019   PNEUMOCOCCAL CONJUGATE-20 01/17/2022   Pfizer Covid-19 Vaccine Bivalent Booster 84yrs & up 09/20/2021   Pfizer(Comirnaty)Fall Seasonal Vaccine 12 years and older 02/14/2022   Pneumococcal Conjugate-13 07/10/2014   Respiratory Syncytial Virus Vaccine,Recomb  Aduvanted(Arexvy) 03/28/2022   Td 12/28/2014   Tdap 02/04/2011, 10/25/2019   Typhoid Inactivated 03/25/2007   Zoster Recombinant(Shingrix) 02/18/2020, 05/04/2020     Objective: Vital Signs: BP 109/74 (BP Location: Left Arm, Patient Position: Sitting, Cuff Size: Normal)   Pulse 92   Resp 14   Ht 5\' 2"  (1.575 m)   Wt 194 lb (88 kg)   BMI 35.48 kg/m    Physical Exam Constitutional:      Appearance: She is obese.  Eyes:     Conjunctiva/sclera: Conjunctivae normal.  Cardiovascular:     Rate and Rhythm: Normal rate and regular rhythm.  Pulmonary:     Effort: Pulmonary effort is normal.     Breath sounds: Normal breath sounds.  Musculoskeletal:     Right lower leg: No edema.     Left lower leg: No edema.  Lymphadenopathy:     Cervical: No cervical adenopathy.  Skin:    General: Skin is warm and dry.     Findings: No rash.  Neurological:     Mental Status: She is alert.  Psychiatric:        Mood and Affect: Mood normal.      Musculoskeletal Exam:  Shoulders full ROM no tenderness or swelling Elbows full ROM no tenderness or swelling Wrists full ROM no tenderness or swelling Wearing right wrist compression glove Fingers full ROM, right 1st CMC joint tenderness to pressure without palpable swelling Knees full ROM no tenderness or swelling, left knee patellofemoral crepitus Ankles full ROM no tenderness or swelling  Investigation: No additional findings.  Imaging: No results found.  Recent Labs: Lab Results  Component Value Date   WBC 10.0 07/23/2023   HGB 14.8 07/23/2023    PLT 227.0 07/23/2023   NA 142 03/12/2023   K 4.2 03/12/2023   CL 105 03/12/2023   CO2 26 03/12/2023   GLUCOSE 84 03/12/2023   BUN 20 03/12/2023   CREATININE 0.87 03/12/2023   BILITOT 0.4 09/04/2022   ALKPHOS 89 10/24/2020   AST 19 09/04/2022   ALT 21 09/04/2022   PROT 6.9 09/04/2022   ALBUMIN 4.3 10/24/2020   CALCIUM 10.0 03/12/2023   QFTBGOLDPLUS NEGATIVE 12/05/2020    Speciality Comments: PLQ Eye Exam: 10/13/2022 WNL @ [facility] Follow up in 1 year   Procedures:  No procedures performed Allergies: Ezetimibe, Latex, Penicillin g, and Sulfa antibiotics   Assessment / Plan:     Visit Diagnoses: Rheumatoid arthritis with rheumatoid factor of multiple sites without organ or systems involvement (HCC) - Plan: Sedimentation rate, hydroxychloroquine  (PLAQUENIL ) 200 MG tablet, celecoxib  (CELEBREX ) 200 MG capsule Well-managed with no recent flare-ups. Current increased pain in right hand seems more 1st CMC OA related without synovitis on exam. - Rechecking sed rate for disease activity monitoring - Continue Plaquenil  400 mg daily - Celebrex  200 mg PRN  High risk medication use - hydroxychloroquine  400 mg daily. PLQ Eye Exam: 10/13/2022 WNL. Celebrex  200 mg daily as needed. - Plan: CBC with Differential/Platelet, Basic Metabolic Panel (BMET)  Osteoarthritis of knee and thumb Mild knee osteoarthritis with minimal symptoms. Right thumb pain due to first [facility] joint osteoarthritis, exacerbated by activities. Thumb brace provides stability. - Use thumb brace for stability.  Degenerative disc disease Chronic back pain with tenderness, worsens with prolonged standing or walking. No sciatica. Previous surgery and degenerative disc disease contribute to symptoms. Stretching and position changes can help. - Incorporate stretching exercises for the lower back muscles. - Change positions frequently to prevent  muscle stiffness.  Anemia of chronic disease Anemia with low  iron levels, normal binding capacity, consistent with non-iron deficiency anemia. Iron supplements used without significant constipation. - Recheck iron levels with blood sample.     Orders: Orders Placed This Encounter  Procedures   Sedimentation rate   CBC with Differential/Platelet   Basic Metabolic Panel (BMET)   Iron, TIBC and Ferritin Panel   Meds ordered this encounter  Medications   hydroxychloroquine  (PLAQUENIL ) 200 MG tablet    Sig: Take 2 tablets (400 mg total) by mouth daily.    Dispense:  60 tablet    Refill:  5   celecoxib  (CELEBREX ) 200 MG capsule    Sig: Take 1 capsule (200 mg total) by mouth daily as needed.    Dispense:  30 capsule    Refill:  1     Follow-Up Instructions: Return in about 6 months (around 03/11/2024) for RA/OA on HCQ f/u 6mos.   Matt Song, MD  Note - This record has been created using [employer].  Chart creation errors have been sought, but may not always  have been located. Such creation errors do not reflect on  the standard of medical care.

## 2023-09-10 ENCOUNTER — Ambulatory Visit: Payer: 59 | Attending: Internal Medicine | Admitting: Internal Medicine

## 2023-09-10 ENCOUNTER — Encounter: Payer: Self-pay | Admitting: Internal Medicine

## 2023-09-10 VITALS — BP 109/74 | HR 92 | Resp 14 | Ht 62.0 in | Wt 194.0 lb

## 2023-09-10 DIAGNOSIS — M25532 Pain in left wrist: Secondary | ICD-10-CM

## 2023-09-10 DIAGNOSIS — G8929 Other chronic pain: Secondary | ICD-10-CM

## 2023-09-10 DIAGNOSIS — Z79899 Other long term (current) drug therapy: Secondary | ICD-10-CM

## 2023-09-10 DIAGNOSIS — M0579 Rheumatoid arthritis with rheumatoid factor of multiple sites without organ or systems involvement: Secondary | ICD-10-CM

## 2023-09-10 DIAGNOSIS — M5442 Lumbago with sciatica, left side: Secondary | ICD-10-CM | POA: Diagnosis not present

## 2023-09-10 DIAGNOSIS — M25562 Pain in left knee: Secondary | ICD-10-CM

## 2023-09-10 DIAGNOSIS — M25531 Pain in right wrist: Secondary | ICD-10-CM

## 2023-09-10 DIAGNOSIS — E611 Iron deficiency: Secondary | ICD-10-CM

## 2023-09-10 MED ORDER — CELECOXIB 200 MG PO CAPS: 200.0000 mg | ORAL_CAPSULE | Freq: Every day | ORAL | 1 refills | Status: DC | PRN

## 2023-09-10 MED ORDER — HYDROXYCHLOROQUINE SULFATE 200 MG PO TABS: 400.0000 mg | ORAL_TABLET | Freq: Every day | ORAL | 5 refills | Status: DC

## 2023-09-11 LAB — CBC WITH DIFFERENTIAL/PLATELET
Absolute Lymphocytes: 2430 {cells}/uL (ref 850–3900)
Absolute Monocytes: 582 {cells}/uL (ref 200–950)
Basophils Absolute: 91 {cells}/uL (ref 0–200)
Basophils Relative: 1 %
Eosinophils Absolute: 246 {cells}/uL (ref 15–500)
Eosinophils Relative: 2.7 %
HCT: 44.1 % (ref 35.0–45.0)
Hemoglobin: 14.6 g/dL (ref 11.7–15.5)
MCH: 28.7 pg (ref 27.0–33.0)
MCHC: 33.1 g/dL (ref 32.0–36.0)
MCV: 86.6 fL (ref 80.0–100.0)
MPV: 10 fL (ref 7.5–12.5)
Monocytes Relative: 6.4 %
Neutro Abs: 5751 {cells}/uL (ref 1500–7800)
Neutrophils Relative %: 63.2 %
Platelets: 277 10*3/uL (ref 140–400)
RBC: 5.09 10*6/uL (ref 3.80–5.10)
RDW: 12.5 % (ref 11.0–15.0)
Total Lymphocyte: 26.7 %
WBC: 9.1 10*3/uL (ref 3.8–10.8)

## 2023-09-11 LAB — BASIC METABOLIC PANEL WITH GFR
BUN: 21 mg/dL (ref 7–25)
CO2: 24 mmol/L (ref 20–32)
Calcium: 9.5 mg/dL (ref 8.6–10.4)
Chloride: 105 mmol/L (ref 98–110)
Creat: 0.93 mg/dL (ref 0.50–1.05)
Glucose, Bld: 80 mg/dL (ref 65–99)
Potassium: 4 mmol/L (ref 3.5–5.3)
Sodium: 139 mmol/L (ref 135–146)
eGFR: 69 mL/min/{1.73_m2} (ref >=60)

## 2023-09-11 LAB — IRON,TIBC AND FERRITIN PANEL
%SAT: 30 % (ref 16–45)
Ferritin: 260 ng/mL (ref 16–288)
Iron: 84 ug/dL (ref 45–160)
TIBC: 276 ug/dL (ref 250–450)

## 2023-09-11 LAB — SEDIMENTATION RATE: Sed Rate: 11 mm/h (ref 0–30)

## 2023-11-22 ENCOUNTER — Other Ambulatory Visit: Payer: Self-pay | Admitting: Gastroenterology

## 2023-12-04 ENCOUNTER — Other Ambulatory Visit: Payer: Self-pay | Admitting: Internal Medicine

## 2023-12-04 DIAGNOSIS — M0579 Rheumatoid arthritis with rheumatoid factor of multiple sites without organ or systems involvement: Secondary | ICD-10-CM

## 2023-12-06 NOTE — Telephone Encounter (Signed)
 Last Fill: 09/10/2023  Labs: 09/10/2023 WNL  Next Visit: 03/17/2024  Last Visit: 09/10/2023  DX: Rheumatoid arthritis with rheumatoid factor of multiple sites without organ or systems involvement (HCC)   Current Dose per office note 09/10/2023: Celebrex  200 mg daily as needed.   Okay to refill Celebrex ?

## 2023-12-10 ENCOUNTER — Telehealth: Payer: Self-pay | Admitting: Pharmacy Technician

## 2023-12-10 ENCOUNTER — Other Ambulatory Visit: Payer: Self-pay | Admitting: Internal Medicine

## 2023-12-10 ENCOUNTER — Encounter: Payer: Self-pay | Admitting: Internal Medicine

## 2023-12-10 ENCOUNTER — Other Ambulatory Visit (HOSPITAL_COMMUNITY): Payer: Self-pay

## 2023-12-10 ENCOUNTER — Other Ambulatory Visit: Payer: Self-pay

## 2023-12-10 ENCOUNTER — Ambulatory Visit (INDEPENDENT_AMBULATORY_CARE_PROVIDER_SITE_OTHER): Payer: 59 | Admitting: Internal Medicine

## 2023-12-10 VITALS — BP 120/82 | HR 88 | Ht 62.0 in | Wt 196.0 lb

## 2023-12-10 DIAGNOSIS — R61 Generalized hyperhidrosis: Secondary | ICD-10-CM | POA: Diagnosis not present

## 2023-12-10 DIAGNOSIS — E039 Hypothyroidism, unspecified: Secondary | ICD-10-CM | POA: Diagnosis not present

## 2023-12-10 DIAGNOSIS — Z6835 Body mass index (BMI) 35.0-35.9, adult: Secondary | ICD-10-CM

## 2023-12-10 DIAGNOSIS — E66812 Obesity, class 2: Secondary | ICD-10-CM | POA: Diagnosis not present

## 2023-12-10 DIAGNOSIS — E041 Nontoxic single thyroid nodule: Secondary | ICD-10-CM | POA: Diagnosis not present

## 2023-12-10 MED ORDER — WEGOVY 0.5 MG/0.5ML ~~LOC~~ SOAJ: 0.5000 mg | SUBCUTANEOUS | 11 refills | Status: AC

## 2023-12-10 MED ORDER — TIRZEPATIDE 2.5 MG/0.5ML ~~LOC~~ SOAJ: 2.5000 mg | SUBCUTANEOUS | Status: DC

## 2023-12-10 MED ORDER — TIRZEPATIDE-WEIGHT MANAGEMENT 5 MG/0.5ML ~~LOC~~ SOLN: 5.0000 mg | SUBCUTANEOUS | 3 refills | Status: DC

## 2023-12-10 NOTE — Telephone Encounter (Signed)
 Pharmacy Patient Advocate Encounter   Received notification from CoverMyMeds that prior authorization for [facility] 0.5MG /0.5ML auto-injectors is required/requested.   Insurance verification completed.   The patient is insured through [facility] .   Per test claim: PA required; Cmm not working. Called insurance and I am waiting for them to fax the clinical questions. PA ID:  [id number] .

## 2023-12-10 NOTE — Patient Instructions (Signed)
 Start Zepbound/Mounjaro 2.5 mg weekly for 4 weeks, then pick up the prescription that is a higher dose of 5 mg weekly and stay on that dose until your next appointment

## 2023-12-10 NOTE — Telephone Encounter (Signed)
 Clinical questions have been answered and PA faxed to [phone]. PA currently Pending. Please be advised that most companies allow up to 30 days to make a decision. We will advise when a determination has been made, or follow up in 1 week.   Please reach out to our team, Rx Prior Auth Pool, if you haven't heard back in a week.

## 2023-12-10 NOTE — Progress Notes (Signed)
 Name: Allison [name]  MRN/ DOB: [id number], 04-24-1961    Age/ Sex: 63 y.o., female    PCP: Hodnett, Recardo SQUIBB, FNP   Reason for Endocrinology Evaluation: Hypothyroidism     Date of Initial Endocrinology Evaluation: 07/25/2021    HPI: Allison [name] is a 63 y.o. female with a past medical history of RA and Hypothyroidism. The patient presented for initial endocrinology clinic visit on 07/25/2021 for consultative assistance with her Hypothyroidism.   She has been diagnosed with hypothyroidism in her adult life . She has been on Levothyroxine   for years   She has noted local neck symptoms  with intermittent swelling. Has been diagnosed with right thyroid  nodule in 2019 , was lost to follow-up after that, no history of FNAs   In January 2023 she has been noted with a TSH of 0.109 u IU/mL, she was on levothyroxine  175 +88 mcg daily, this was reduced to levothyroxine  175 +25 mcg with repeat TSH 0.069 uIU/mL  We have reduced her levothyroxine  dose to optimize TSH  Repeat ultrasound on May/12/2021 revealed a stable 1.5 right centimeter thyroid  nodule but due to peripheral calcifications and FNA has been recommended.  Unfortunately biopsy had to be halted after 3 attempts due to the patient having sore throat. Another attempt was made 12/2021 but the sample was acellular   No family history of thyroid  disease   Transferred care from Dr. Nichole  SUBJECTIVE:    Today (12/10/23): Allison [name] is here for follow-up on hypothyroidism and right thyroid  nodule.   Patient has been noted with weight gain over the past year Denies local neck swelling  Minimal  palpitations  No constipation but no diarrhea  No biotin intake She is c/o hyperhydrosis, mainly on the face and torso  Has noted a weight gain   Levothyroxine  150 mcg daily    HISTORY:  Past Medical History:  Past Medical History:  Diagnosis Date   Anemia    Anxiety    COPD (chronic obstructive pulmonary disease) (HCC)    Depression     GERD (gastroesophageal reflux disease)    diet controlled , tums occasional   Hemorrhoids    Hyperlipidemia    Hypertension    Kidney stones    Rheumatoid arthritis (HCC)    Thyroid  disease    Past Surgical History:  Past Surgical History:  Procedure Laterality Date   APPENDECTOMY     arm surgery Right    nerve moved   BACK SURGERY     x 2 - lumbar - replaced 2 disc   CHOLECYSTECTOMY     COLONOSCOPY  07/2014   hx polyps [place]   kidney stones     x 2 - removed by surgery   LAPAROTOMY     cyst removed from ovary   SHOULDER SURGERY Right    TUBAL LIGATION     WISDOM TOOTH EXTRACTION      Social History:  reports that she quit smoking about 5 years ago. Her smoking use included cigarettes. She started smoking about 45 years ago. She has a 40 pack-year smoking history. She has been exposed to tobacco smoke. She has never used smokeless tobacco. She reports current alcohol use. She reports that she does not use drugs. Family History: family history includes Asthma in her mother; Colon polyps in her brother and brother; Heart disease in her brother and father; Osteoarthritis in her sister; Thyroid  disease in her daughter.   HOME MEDICATIONS: Allergies as of 12/10/2023  Reactions   Ezetimibe Other (See Comments)   Latex Itching   Penicillin G Hives   Sulfa Antibiotics Hives        Medication List        Accurate as of December 10, 2023  9:42 AM. If you have any questions, ask your nurse or doctor.          ALPRAZolam 0.5 MG tablet Commonly known as: XANAX 1 tab(s)   amitriptyline 10 MG tablet Commonly known as: ELAVIL   aspirin EC 81 MG tablet TAKE 1 TABLET BY MOUTH EVERY DAY   atorvastatin 80 MG tablet Commonly known as: LIPITOR TAKE 1 TABLET BY MOUTH ONE TIME DAILY AT BEDTIME   celecoxib  200 MG capsule Commonly known as: CELEBREX  TAKE 1 CAPSULE BY MOUTH DAILY AS NEEDED.   cetirizine 10 MG tablet Commonly known as: ZYRTEC Take 10 mg by  mouth as needed.   DULoxetine 20 MG capsule Commonly known as: CYMBALTA Take 20 mg by mouth daily.   FIBER PO Take by mouth 2 (two) times daily.   gabapentin  300 MG capsule Commonly known as: NEURONTIN  Take 1 capsule (300 mg total) by mouth 2 (two) times daily.   hydroxychloroquine  200 MG tablet Commonly known as: PLAQUENIL  Take 2 tablets (400 mg total) by mouth daily.   IRON PO Take by mouth daily.   levalbuterol 45 MCG/ACT inhaler Commonly known as: XOPENEX HFA Inhale 2 puffs into the lungs every 6 (six) hours as needed for wheezing.   levothyroxine  150 MCG tablet Commonly known as: SYNTHROID  Take 1 tablet (150 mcg total) by mouth daily.   MULTIVITAMIN PO Take by mouth daily.   omeprazole  20 MG capsule Commonly known as: PRILOSEC Take 1 capsule (20 mg total) by mouth daily. Please schedule a yearly follow up for further refills. Thank you   sertraline 100 MG tablet Commonly known as: ZOLOFT 1 tab(s)   triamcinolone 55 MCG/ACT Aero nasal inhaler Commonly known as: NASACORT SPRAY 2 SPRAYS EVERY DAY BY INTRANASAL ROUTE FOR 45 DAYS.   valsartan  80 MG tablet Commonly known as: DIOVAN  TAKE 1 TABLET BY MOUTH EVERY DAY          REVIEW OF SYSTEMS: A comprehensive ROS was conducted with the patient and is negative except as per HPI    OBJECTIVE:  VS: BP 120/82 (BP Location: Left Arm, Patient Position: Sitting, Cuff Size: Normal)   Pulse 88   Ht 5' 2 (1.575 m)   Wt 196 lb (88.9 kg)   SpO2 97%   BMI 35.85 kg/m    Wt Readings from Last 3 Encounters:  12/10/23 196 lb (88.9 kg)  09/10/23 194 lb (88 kg)  03/12/23 182 lb (82.6 kg)     EXAM: General: Pt appears well and is in NAD  Neck: General: Supple without adenopathy. Thyroid : Minimal asymmetry on the right, but unable to appreciate an actual nodule  Lungs: Clear with good BS bilat   Heart: Auscultation: RRR.  Extremities:  BL LE: trace pretibial edema   Mental Status: Judgment, insight:  Intact Orientation: Oriented to time, place, and person Mood and affect: No depression, anxiety, or agitation     DATA REVIEWED:    Thyroid  ultrasound 12/25/2022 Estimated total number of nodules >/= 1 cm: 1   Number of spongiform nodules >/=  2 cm not described below (TR1): 0   Number of mixed cystic and solid nodules >/= 1.5 cm not described below (TR2): 0   _________________________________________________________   1.6 x 1.4  x 1.1 cm right inferior thyroid  nodule is unchanged in size. Please correlate with prior FNA results from 12/18/2021.   No new thyroid  nodules are seen.   IMPRESSION: Previously biopsied right thyroid  nodule is unchanged. Please correlate with prior FNA results. No new thyroid  nodules.     FNA 12/18/2021  Clinical History: Nodule #1: Right; Inferior, Maximum size: 1.5 cm,  previously 1.7 cm; Other 2 dimensions: 1.3 x 1.0 cm, solid/almost  completely solid, isoechoic, Echogenic foci: peripheral calcifications,  TI-RADS total points 5. Previously biopsied 10/02/21  Specimen Submitted:  A. THYROID , RT INFERIOR, FINE NEEDLE ASPIRATION:    FINAL MICROSCOPIC DIAGNOSIS:  - Nondiagnostic material   SPECIMEN ADEQUACY:  INSUFFICIENT FOR DIAGNOSIS. The specimen is processed and examined  microscopically, but found to be UNSATISFACTORY for evaluation.   DIAGNOSTIC COMMENTS:  The specimen is essentially acellular.   ASSESSMENT/PLAN/RECOMMENDATIONS:   Hypothyroidism:  -Pt is euthyroid  - TSH elevated, will increase levothyroxine  as below  Medications :  Change levothyroxine  150 mcg, 2 tabs on Sundays and 1 tablet rest of the week  2. Right thyroid  nodule   - Right inferior 1.5 cm nodule  with calcifications, an attempt at FNA was unsuccessful  in 09/2021 as the pt did not tolerate the procedure. S/P FNA in 12/2021 with insufficient cellularity  -We did discuss the high risk of malignancy with calcifications, patient is not interested in thyroid   surgery, we have opted to proceed with repeat thyroid  ultrasound and revisit depending on thyroid  nodule features  3.  Hyperhidrosis  -We discussed differential diagnosis to include thyroid  related, deconditioning, genetic causes, post menopause - I have offered a referral to dermatology, she would like to think about that   4.  Obesity  -Patient interested in weight loss medications - Counseled about the importance of lifestyle changes with following a diet as well as exercise with brisk walking 25-30 minutes up to 3 times a week -Caution against GI side effects with GLP-1 agonist - She was provided with number for pens of Mounjaro  2.5 mg, unfortunately her insurance did not cover Zepbound , a prescription for Wegovy  will be sent  Medication Take Wegovy  0.5 mg weekly  Follow-up in 4 months  Signed electronically by: Stefano [name] Butts, MD  [facility] Endocrinology  [facility] [address] [place] [place], [place] [address] Phone: [phone] FAX: [phone]   CC: Hodnett, Recardo SQUIBB, FNP [address] [place] [address] Phone: [phone] Fax: [phone]   Return to Endocrinology clinic as below: Future Appointments  Date Time Provider Department Center  12/10/2023  9:50 AM Allison [name], Allison Redgie, MD [facility] None  03/17/2024 10:00 AM Jeannetta Lonni ORN, MD [facility] None

## 2023-12-11 ENCOUNTER — Other Ambulatory Visit (HOSPITAL_COMMUNITY): Payer: Self-pay

## 2023-12-13 ENCOUNTER — Ambulatory Visit: Payer: Self-pay | Admitting: Internal Medicine

## 2023-12-13 ENCOUNTER — Other Ambulatory Visit (HOSPITAL_COMMUNITY): Payer: Self-pay

## 2023-12-13 ENCOUNTER — Other Ambulatory Visit: Payer: Self-pay | Admitting: Internal Medicine

## 2023-12-13 MED ORDER — LEVOTHYROXINE SODIUM 150 MCG PO TABS: 150.0000 ug | ORAL_TABLET | ORAL | 3 refills | Status: DC

## 2023-12-13 NOTE — Telephone Encounter (Signed)
 Pharmacy Patient Advocate Encounter  Received notification from [facility] that Prior Authorization for Wegovy  0.5MG /0.5ML auto-injectors  has been APPROVED from 12/10/23 to 07/12/24. Ran test claim, Copay is $24.99. This test claim was processed through [facility]- copay amounts may vary at other pharmacies due to pharmacy/plan contracts, or as the patient moves through the different stages of their insurance plan.   PA #/Case ID/Reference #: [id number]

## 2023-12-14 ENCOUNTER — Ambulatory Visit
Admission: RE | Admit: 2023-12-14 | Discharge: 2023-12-14 | Disposition: A | Discharge status: Home / Self Care | Source: Ambulatory Visit | Attending: Internal Medicine | Admitting: Internal Medicine

## 2023-12-14 DIAGNOSIS — E041 Nontoxic single thyroid nodule: Secondary | ICD-10-CM

## 2023-12-16 LAB — TSH: TSH: 5.28 m[IU]/L — ABNORMAL HIGH (ref 0.40–4.50)

## 2023-12-16 LAB — INSULIN-LIKE GROWTH FACTOR
IGF-I, LC/MS: 119 ng/mL (ref 41–279)
Z-Score (Female): 0 {STDV} (ref ?–2.0)

## 2023-12-16 LAB — T4, FREE: Free T4: 1.3 ng/dL (ref 0.8–1.8)

## 2023-12-16 LAB — CALCITONIN: Calcitonin: <2 pg/mL (ref <=5)

## 2024-02-17 ENCOUNTER — Other Ambulatory Visit: Payer: Self-pay | Admitting: Gastroenterology

## 2024-03-04 ENCOUNTER — Other Ambulatory Visit: Payer: Self-pay | Admitting: Internal Medicine

## 2024-03-04 DIAGNOSIS — M0579 Rheumatoid arthritis with rheumatoid factor of multiple sites without organ or systems involvement: Secondary | ICD-10-CM

## 2024-03-06 NOTE — Telephone Encounter (Signed)
 Last Fill: 12/06/2023  Labs: 09/10/2023 WNL  Next Visit: 03/17/2024  Last Visit: 09/10/2023  DX: Rheumatoid arthritis with rheumatoid factor of multiple sites without organ or systems involvement   Current Dose per office note 09/10/2023: Celebrex  200 mg daily as needed.   Okay to refill Celebrex ?

## 2024-03-07 NOTE — Progress Notes (Signed)
 Office Visit Note  Patient: Allison [name]             Date of Birth: 11-16-1960           MRN: [id number]             PCP: Patria Recardo SQUIBB, FNP Referring: Patria Recardo SQUIBB, FNP Visit Date: 03/17/2024   Subjective:  Rheumatoid Arthritis (No Concerns. Has not update PLQ eye exam.Will call to schedule. )  Discussed the use of AI scribe software for clinical note transcription with the patient, who gave verbal consent to proceed.  History of Present Illness   Allison [name] is a 63 y.o. female here for follow up for seropositive RA on hydroxychloroquine  400 mg daily and celebrex  200 mg as needed.     Her arthritis has been manageable with only occasional small flare-ups. She uses massage and Tylenol for relief and notes that the condition is 'a lot better than it was.' She takes Celebrex  occasionally, particularly if she anticipates increased physical activity, such as walking. She experiences some discomfort in her hands, especially at night after using a computer mouse all day, but overall, her symptoms have improved.  She is currently taking Plaquenil  (hydroxychloroquine ) regularly and has not experienced any significant side effects. She mentions the need for a routine ophthalmology appointment, as her last eye exam was in May of the previous year.  She has a thyroid  nodule that cannot be biopsied due to its hardness. Her hormone levels, including thyroid  and parathyroid, are within normal ranges except for slightly increased TSH.   No significant viral illnesses or need for antibiotics this year. No significant side effects from her current medications.       Previous HPI 09/10/2023 Allison [name] is a 63 y.o. 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     She experiences a flare-up of pain in her right hand, specifically in the thumb area, which began earlier this week. The pain is described as 'just a little bit sore' and is  possibly related to stress or excessive use of the computer mouse. She manages the pain with a supportive brace and over-the-counter medications as needed.   She has a history of anemia and has been taking iron supplements for a couple of years. A recent follow-up in the GI clinic noted low iron levels but normal binding capacity. She does not experience significant side effects from the iron supplements, occasionally using a stool softener to prevent constipation.   She mentions a past incident where she fell on a sand dune, resulting in significant pain in her abdomen and trunk. X-rays were performed to rule out fractures. The pain lasted about two weeks, and she still experiences occasional soreness.   She has osteoarthritis in her knees but reports minimal trouble recently. She uses acetaminophen and occasionally Celebrex  for pain management, particularly for her back pain. She avoids ibuprofen  due to previous advice.   She experiences chronic back pain, exacerbated by prolonged standing or walking. The pain starts in the lower back and sometimes radiates down the leg. She has had a hip replacement and notes that the pain is more pronounced on the left side. She does not engage in regular exercise or stretching, which she acknowledges as a contributing factor to her discomfort.   No significant congestion or drainage despite the pollen season and only occasional morning cough. No recent illness.  Previous HPI 03/12/2023 Allison [name] is a 63 y.o. 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  Overall joint inflammation has been doing well still has frequent days with increased joint pain most commonly affecting her right fifth finger and at the left knee.  Often gets acute knee pain with weightbearing climbing stairs.  She finds using a knee brace at the start of increased pain is very helpful otherwise has had episodes where she developed visible swelling.   She takes ibuprofen  800 mg as needed estimates this is a few days per week.  She had upper and lower endoscopy in August with evidence of gastritis so was recommended to limit NSAID exposure.  Also with some polyps no other specific cause for her iron deficiency identified on the study.   Previous HPI 09/04/2022 Allison [name] is a 63 y.o. 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  Overall she been doing well from a joint standpoint though in the past few weeks has experienced increased pain and stiffness especially at her left second third MCP joints.  Sometimes swelling throughout the entire third digit.  This is varied in intensity from day-to-day usually when it is worse she gets some relief from the ibuprofen  800 mg as needed.  Also has problem with ongoing nonproductive cough for the past few months.  Has had previous episodes of prolonged chronic cough usually provoked by some type of illness she had URI symptoms back in December.  Just had recent chest x-ray after follow-up in pulmonology clinic and switching from lisinopril to valsartan .    Previous HPI 03/06/22 Allison [name] is a 63 y.o. 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  Her inflammatory joint symptoms have been well improved she had one episode of increased pain in her hands he associated with adjustment to her home office desk and working computer configuration.  Besides this her main issue is pain at the low back.  This is most significant at the midline and left side occasional radiation down the left leg.  Symptoms are frequently provoked if she walks for long periods if she pushes past a early or minor amount of pain will have more symptoms that can take hours or a day to recover.  She gets relief when taking about 800 mg ibuprofen  as needed for back pain exacerbations.  She had interval follow-up with her ophthalmology with no problems on hydroxychloroquine   retinal toxicity screening.   Previous HPI 09/19/2021 Allison [name] is a 63 y.o. female here for follow up  for seropositive RA after starting hydroxychloroquine  400 mg daily and also some plantar fasciitis. She is doing well overall today. Morning stiffness is minimal and no flare ups or very swollen joints. She has noticed some increased low back and left hip pain this gets worse after prolonged time walking. She occasionally has numbness or pain radiating down the leg or falling asleep but not all the time. She has not fallen or lost balance. She does not have increased hip pain lying on her side to sleep.   Previous HPI 02/07/2021 Idella Lorincz is a 64 y.o. female here for follow up for seropositive RA after starting hydroxychloroquine  400 mg daily and also some plantar fasciitis.  She feels symptoms are substantially improved with decreased swelling stiffness and improve range of motion in her hands.  She has changed footwear with recommendations from Dr. Claudene also having improvement in  the plantar fasciitis.  She has not noticed any problems taking the medication.  She has not yet seen her scheduled ophthalmology exam she states she has an upcoming eye exam this winter.   Previous HPI 12/05/20 Georgette Zwicker is a 63 y.o. female here for multiple joint pains including bilateral hands. She was also referred for NCS this did not demonstrate abnormalities consistent with her symptoms.  She has had joint pains to some degree for a while but particularly noticed increased pain and stiffness decreased range of motion and intermittent swelling in joints of several fingers during the past few months.  She is also noticed increase in bilateral pain on the bottoms of the feet that is worst first thing in the morning when weightbearing and improves walking throughout the day.   Labs reviewed 10/2020 ANA 1:40 speckled, cytoplasmic ACE 52 CBC unremarkable CMP wnl CRP wnl RF 268 CCP >250 TSH 0.19 Uric acid 3.9 Iron  sat 7.8% PTH wnl     Review of Systems  Constitutional:  Negative for fatigue.  HENT:  Positive for mouth dryness. Negative for mouth sores.   Eyes:  Negative for dryness.  Respiratory:  Negative for shortness of breath.   Cardiovascular:  Negative for chest pain and palpitations.  Gastrointestinal:  Positive for constipation. Negative for blood in stool and diarrhea.  Endocrine: Negative for increased urination.  Genitourinary:  Positive for involuntary urination.  Musculoskeletal:  Positive for morning stiffness. Negative for joint pain, gait problem, joint pain, joint swelling, myalgias, muscle weakness, muscle tenderness and myalgias.  Skin:  Negative for color change, rash, hair loss and sensitivity to sunlight.  Allergic/Immunologic: Negative for susceptible to infections.  Neurological:  Positive for headaches. Negative for dizziness.  Hematological:  Negative for swollen glands.  Psychiatric/Behavioral:  Negative for depressed mood and sleep disturbance. The patient is not nervous/anxious.     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   Acquired hypothyroidism 12/10/2023   Class 2 obesity with body mass index (BMI) of 35.0 to 35.9 in adult 12/10/2023   Pain in left knee 03/12/2023   COPD (chronic obstructive pulmonary disease) (HCC) 11/25/2022   Tobacco use 11/25/2022   Chronic cough 09/01/2022   Low back pain 03/06/2022   Rheumatoid arthritis with rheumatoid factor of multiple sites without organ or systems involvement (HCC) 12/05/2020   Iron deficiency 12/05/2020   Plantar fasciitis, bilateral 12/05/2020   High risk medication use 12/05/2020   Polyarthralgia 10/24/2020   Wrist pain 10/24/2020    Past Medical History:  Diagnosis Date   Anemia    Anxiety    COPD (chronic obstructive pulmonary disease) (HCC)    Depression    GERD (gastroesophageal reflux disease)    diet controlled , tums occasional    Hemorrhoids    Hyperlipidemia    Hypertension    Kidney stones    Neuromuscular disorder (HCC)    Rheumatoid arthritis (HCC)    Thyroid  disease     Family History  Problem Relation Age of Onset   Asthma Mother    Anxiety disorder Mother    Miscarriages / Stillbirths Mother    Heart disease Father    Osteoarthritis Sister    Colon polyps Brother    Colon polyps Brother    Heart disease Brother    Thyroid  disease Daughter    Depression Brother    Obesity Sister    Colon cancer Neg Hx    Rectal cancer  Neg Hx    Stomach cancer Neg Hx    Esophageal cancer Neg Hx    Past Surgical History:  Procedure Laterality Date   APPENDECTOMY     arm surgery Right    nerve moved   BACK SURGERY     x 2 - lumbar - replaced 2 disc   CHOLECYSTECTOMY     COLONOSCOPY  07/2014   hx polyps [place], [place]   kidney stones     x 2 - removed by surgery   LAPAROTOMY     cyst removed from ovary   SHOULDER SURGERY Right    SPINE SURGERY     TUBAL LIGATION     WISDOM TOOTH EXTRACTION     Social History   Social History Narrative   Not on file   Immunization History  Administered Date(s) Administered   Fluzone Influenza virus vaccine,trivalent (IIV3), split virus 05/01/2014   H1N1 05/28/2008   Hep A / Hep B 03/25/2007   Hepatitis B, ADULT 04/22/2007, 07/10/2014   IPV 03/25/2007   Influenza Inj Mdck Quad Pf 02/12/2018, 02/12/2019, 02/08/2021, 01/17/2022   Influenza Split 03/25/2007   Influenza,inj,Quad PF,6+ Mos 04/05/2017, 02/03/2020   Influenza-Unspecified 01/17/2022   Moderna Covid-19 Vaccine Bivalent Booster 54yrs & up 01/25/2021   Moderna SARS-COV2 Booster Vaccination 04/07/2020, 09/06/2020   Moderna Sars-Covid-2 Vaccination 08/30/2019, 09/27/2019   PNEUMOCOCCAL CONJUGATE-20 01/17/2022   Pfizer Covid-19 Vaccine Bivalent Booster 81yrs & up 09/20/2021   Pfizer(Comirnaty)Fall Seasonal Vaccine 12 years and older 02/14/2022   Pneumococcal Conjugate-13 07/10/2014   Respiratory  Syncytial Virus Vaccine,Recomb Aduvanted(Arexvy) 03/28/2022   Td 12/28/2014   Tdap 02/04/2011, 10/25/2019   Typhoid Inactivated 03/25/2007   Zoster Recombinant(Shingrix) 02/18/2020, 05/04/2020     Objective: Vital Signs: BP 123/80 (BP Location: Right Arm, Patient Position: Sitting, Cuff Size: Small)   Pulse 84   Temp 97.8 F (36.6 C)   Resp 12   Ht 5' 1 (1.549 m)   Wt 175 lb 6.4 oz (79.6 kg)   BMI 33.14 kg/m    Physical Exam Constitutional:      Appearance: She is obese.  Eyes:     Conjunctiva/sclera: Conjunctivae normal.  Cardiovascular:     Rate and Rhythm: Normal rate and regular rhythm.  Pulmonary:     Effort: Pulmonary effort is normal.     Breath sounds: Normal breath sounds.  Musculoskeletal:     Right lower leg: No edema.     Left lower leg: No edema.  Skin:    General: Skin is warm and dry.     Findings: No rash.  Neurological:     Mental Status: She is alert.  Psychiatric:        Mood and Affect: Mood normal.      Musculoskeletal Exam:  Shoulders full ROM no tenderness or swelling Elbows full ROM no tenderness or swelling Wrists full ROM no tenderness or swelling Fingers full ROM, slight swelling at right second MCP and left third MCP no tenderness to pressure some discomfort limiting full flexion range of motion Knees full ROM no tenderness or swelling, left greater than right patellofemoral crepitus Ankles full ROM no tenderness or swelling  Investigation: No additional findings.  Imaging: No results found.  Recent Labs: Lab Results  Component Value Date   WBC 9.1 09/10/2023   HGB 14.6 09/10/2023   PLT 277 09/10/2023   NA 139 09/10/2023   K 4.0 09/10/2023   CL 105 09/10/2023   CO2 24 09/10/2023   GLUCOSE 80 09/10/2023  BUN 21 09/10/2023   CREATININE 0.93 09/10/2023   BILITOT 0.4 09/04/2022   ALKPHOS 89 10/24/2020   AST 19 09/04/2022   ALT 21 09/04/2022   PROT 6.9 09/04/2022   ALBUMIN 4.3 10/24/2020   CALCIUM 9.5 09/10/2023    QFTBGOLDPLUS NEGATIVE 12/05/2020    Speciality Comments: PLQ Eye Exam: 10/13/2022 WNL @ [facility] [place] Follow up in 1 year   Procedures:  No procedures performed Allergies: Ezetimibe, Latex, Penicillin g, and Sulfa antibiotics   Assessment / Plan:     Visit Diagnoses: Rheumatoid arthritis with rheumatoid factor of multiple sites without organ or systems involvement (HCC) - Plan: hydroxychloroquine  (PLAQUENIL ) 200 MG tablet Rheumatoid arthritis with rheumatoid factor, multiple sites Rheumatoid arthritis well-managed with minor flare-ups. Current regimen effective, no side effects. Normal blood tests in April.  There is some trace synovitis it appears on exam today. - Continue hydroxychloroquine  400 mg daily - Schedule routine eye exam for hydroxychloroquine  monitoring. Left knee osteoarthritis Describes some increased pain on days with higher physical activity and walking long distance.  No inflammatory changes on exam but does have patellofemoral consistent with osteoarthritis.  No joint instability falls or near falls. - Continue celebrex  200 mg PRN   Orders: No orders of the defined types were placed in this encounter.  Meds ordered this encounter  Medications   hydroxychloroquine  (PLAQUENIL ) 200 MG tablet    Sig: Take 2 tablets (400 mg total) by mouth daily.    Dispense:  180 tablet    Refill:  1     Follow-Up Instructions: Return in about 6 months (around 09/14/2024) for RA on HCQ/NSAID f/u 6mos.   Lonni LELON Ester, MD  Note - This record has been created using [employer].  Chart creation errors have been sought, but may not always  have been located. Such creation errors do not reflect on  the standard of medical care.

## 2024-03-12 ENCOUNTER — Other Ambulatory Visit: Payer: Self-pay | Admitting: Internal Medicine

## 2024-03-12 DIAGNOSIS — M0579 Rheumatoid arthritis with rheumatoid factor of multiple sites without organ or systems involvement: Secondary | ICD-10-CM

## 2024-03-17 ENCOUNTER — Encounter: Payer: Self-pay | Admitting: Internal Medicine

## 2024-03-17 ENCOUNTER — Ambulatory Visit: Attending: Internal Medicine | Admitting: Internal Medicine

## 2024-03-17 VITALS — BP 123/80 | HR 84 | Temp 97.8°F | Resp 12 | Ht 61.0 in | Wt 175.4 lb

## 2024-03-17 DIAGNOSIS — G8929 Other chronic pain: Secondary | ICD-10-CM

## 2024-03-17 DIAGNOSIS — M0579 Rheumatoid arthritis with rheumatoid factor of multiple sites without organ or systems involvement: Secondary | ICD-10-CM | POA: Diagnosis not present

## 2024-03-17 DIAGNOSIS — M25562 Pain in left knee: Secondary | ICD-10-CM

## 2024-03-17 DIAGNOSIS — Z79899 Other long term (current) drug therapy: Secondary | ICD-10-CM

## 2024-03-17 MED ORDER — HYDROXYCHLOROQUINE SULFATE 200 MG PO TABS
400.0000 mg | ORAL_TABLET | Freq: Every day | ORAL | 1 refills | Status: AC
Start: 2024-03-17 — End: ?

## 2024-03-21 ENCOUNTER — Encounter: Payer: Self-pay | Admitting: Internal Medicine

## 2024-04-07 ENCOUNTER — Encounter: Payer: Self-pay | Admitting: Emergency Medicine

## 2024-04-07 ENCOUNTER — Ambulatory Visit: Admitting: Emergency Medicine

## 2024-04-07 VITALS — BP 128/76 | HR 85 | Temp 98.6°F | Ht 61.0 in | Wt 173.0 lb

## 2024-04-07 DIAGNOSIS — J449 Chronic obstructive pulmonary disease, unspecified: Secondary | ICD-10-CM | POA: Diagnosis not present

## 2024-04-07 DIAGNOSIS — R053 Chronic cough: Secondary | ICD-10-CM | POA: Diagnosis not present

## 2024-04-07 NOTE — Assessment & Plan Note (Signed)
 Much improved.  She is still on omeprazole  for GERD control which seems adequate.  She has been able to wean off maintenance allergy medication.  The discontinuation of her ACE inhibitor did seem to help significantly.  She will call me if her cough returns so we can troubleshoot.

## 2024-04-07 NOTE — Assessment & Plan Note (Signed)
 Mild minimal symptoms.  She is not on any maintenance BD therapy.  She has Xopenex available but has not required it.  We did discuss the possibility that dyspnea could become more intrusive as she gets older.  She will let me know if her breathing changes and we can consider either repeat PFT or possible empiric BD therapy for her.  She has Xopenex available to use as needed

## 2024-04-07 NOTE — Patient Instructions (Addendum)
 Please continue your omeprazole  once daily as you have been taking it. If you begin to develop more active congestion and allergy symptoms you could consider getting back on your Zyrtec or your fluticasone nasal spray on a schedule.  For now okay to use these only as needed Keep your Xopenex available to use 2 puffs if needed for shortness of breath. Please contact our office if you have increasing cough or increasing short windedness so we can reassess your status.

## 2024-04-07 NOTE — Progress Notes (Signed)
 Subjective:    Patient ID: Allison [name], female    DOB: 1960-06-24, 63 y.o.   MRN: [id number]  Cough   ROV 04/07/2024 --63 year old woman with a history of tobacco use and suspected mild obstruction based on her PFT postbronchodilator trials.  I seen her for chronic cough.  She also has rheumatoid arthritis on hydroxychloroquine , GERD with LPR, hypertension, hypothyroidism.  She benefited from discontinuation of her ACE inhibitor and changed to valsartan . She has been doing very well - cough has not been intrusive. She has not had any flares. She has noticed some exertional SOB with stairs, hills. She is on Wegovy  and has lost some wt. She has xopenex but has never needed to use it. She has also weaned herself off the nasal spray uses zyrtec prn. Remains on omeprazole . Her RA has been well controlled.   Review of Systems  Respiratory:  Positive for cough.    As per HPI  Past Medical History:  Diagnosis Date   Anemia    Anxiety    COPD (chronic obstructive pulmonary disease) (HCC)    Depression    GERD (gastroesophageal reflux disease)    diet controlled , tums occasional   Hemorrhoids    Hyperlipidemia    Hypertension    Kidney stones    Neuromuscular disorder (HCC)    Rheumatoid arthritis (HCC)    Thyroid  disease      Family History  Problem Relation Age of Onset   Asthma Mother    Anxiety disorder Mother    Miscarriages / Stillbirths Mother    Heart disease Father    Osteoarthritis Sister    Colon polyps Brother    Colon polyps Brother    Heart disease Brother    Thyroid  disease Daughter    Depression Brother    Obesity Sister    Colon cancer Neg Hx    Rectal cancer Neg Hx    Stomach cancer Neg Hx    Esophageal cancer Neg Hx      Social History   Socioeconomic History   Marital status: Married    Spouse name: Not on file   Number of children: Not on file   Years of education: Not on file   Highest education level: Not on file  Occupational History   Not  on file  Tobacco Use   Smoking status: Former    Current packs/day: 0.00    Average packs/day: 1 pack/day for 40.0 years (40.0 ttl pk-yrs)    Types: Cigarettes    Start date: 84    Quit date: 2020    Years since quitting: 5.8    Passive exposure: Past   Smokeless tobacco: Never   Tobacco comments:    Quit smoking 2024.   Vaping Use   Vaping status: Former  Substance and Sexual Activity   Alcohol use: Yes    Comment: socially   Drug use: Never   Sexual activity: Not on file  Other Topics Concern   Not on file  Social History Narrative   Not on file   Social Drivers of Health   Financial Resource Strain: Not on file  Food Insecurity: Not on file  Transportation Needs: Not on file  Physical Activity: Not on file  Stress: Not on file  Social Connections: Not on file  Intimate Partner Violence: Not on file     Allergies  Allergen Reactions   Ezetimibe Other (See Comments)   Latex Itching   Penicillin G Hives   Sulfa  Antibiotics Hives     Outpatient Medications Prior to Visit  Medication Sig Dispense Refill   ALPRAZolam (XANAX) 0.5 MG tablet 1 tab(s)     amitriptyline (ELAVIL) 10 MG tablet      aspirin 81 MG EC tablet TAKE 1 TABLET BY MOUTH EVERY DAY     atorvastatin (LIPITOR) 80 MG tablet TAKE 1 TABLET BY MOUTH ONE TIME DAILY AT BEDTIME     celecoxib  (CELEBREX ) 200 MG capsule TAKE 1 CAPSULE BY MOUTH EVERY DAY AS NEEDED 30 capsule 2   cetirizine (ZYRTEC) 10 MG tablet Take 10 mg by mouth as needed.     DULoxetine (CYMBALTA) 20 MG capsule Take 20 mg by mouth daily.     Ferrous Sulfate (IRON PO) Take by mouth daily.     hydroxychloroquine  (PLAQUENIL ) 200 MG tablet Take 2 tablets (400 mg total) by mouth daily. 180 tablet 1   levothyroxine  (SYNTHROID ) 150 MCG tablet 2 tabs on Sundays and 1 tablet rest of the week 104 tablet 3   Multiple Vitamin (MULTIVITAMIN PO) Take by mouth daily.     omeprazole  (PRILOSEC) 20 MG capsule TAKE 1 CAPSULE (20 MG TOTAL) BY MOUTH DAILY.  PLEASE SCHEDULE A YEARLY FOLLOW UP FOR FURTHER REFILLS. THANK YOU 90 capsule 0   Semaglutide -Weight Management (WEGOVY ) 0.5 MG/0.5ML SOAJ Inject 0.5 mg into the skin once a week. 2 mL 11   sertraline (ZOLOFT) 100 MG tablet 1 tab(s)     valsartan  (DIOVAN ) 80 MG tablet TAKE 1 TABLET BY MOUTH EVERY DAY 90 tablet 2   FIBER PO Take by mouth 2 (two) times daily.     gabapentin  (NEURONTIN ) 300 MG capsule Take 1 capsule (300 mg total) by mouth 2 (two) times daily. (Patient not taking: Reported on 03/17/2024) 180 capsule 1   levalbuterol (XOPENEX HFA) 45 MCG/ACT inhaler Inhale 2 puffs into the lungs every 6 (six) hours as needed for wheezing. (Patient not taking: Reported on 04/07/2024) 15 g 3   triamcinolone (NASACORT) 55 MCG/ACT AERO nasal inhaler SPRAY 2 SPRAYS EVERY DAY BY INTRANASAL ROUTE FOR 45 DAYS. (Patient not taking: Reported on 04/07/2024)     Facility-Administered Medications Prior to Visit  Medication Dose Route Frequency Provider Last Rate Last Admin   0.9 %  sodium chloride  infusion  500 mL Intravenous Once Armbruster, Elspeth SQUIBB, MD            Objective:   Physical Exam  Vitals:   04/07/24 1000  BP: 128/76  Pulse: 85  Temp: 98.6 F (37 C)  SpO2: 97%  Weight: 173 lb (78.5 kg)  Height: 5' 1 (1.549 m)   Gen: Pleasant, well-nourished, in no distress,  normal affect  ENT: No lesions,  mouth clear, no nasal drainage but she does sound congested.  S strong voice  Neck: No JVD, no stridor  Lungs: No use of accessory muscles, good air movement.  Clear bilaterally no wheeze.  No referred noise  Musculoskeletal: No deformities, no cyanosis or clubbing  Neuro: alert, awake, non focal  Skin: Warm, no lesions or rashes      Assessment & Plan:  COPD (chronic obstructive pulmonary disease) (HCC) Mild minimal symptoms.  She is not on any maintenance BD therapy.  She has Xopenex available but has not required it.  We did discuss the possibility that dyspnea could become more  intrusive as she gets older.  She will let me know if her breathing changes and we can consider either repeat PFT or possible empiric BD therapy  for her.  She has Xopenex available to use as needed  Chronic cough Much improved.  She is still on omeprazole  for GERD control which seems adequate.  She has been able to wean off maintenance allergy medication.  The discontinuation of her ACE inhibitor did seem to help significantly.  She will call me if her cough returns so we can troubleshoot.    Lamar Chris, MD, PhD 04/07/2024, 10:24 AM [facility] [phone] or if no answer before 7:00PM call [phone] For any issues after 7:00PM please call eLink [phone]

## 2024-04-19 LAB — OPHTHALMOLOGY REPORT-SCANNED

## 2024-04-21 ENCOUNTER — Ambulatory Visit: Admitting: Internal Medicine

## 2024-04-21 ENCOUNTER — Encounter: Payer: Self-pay | Admitting: Internal Medicine

## 2024-04-21 ENCOUNTER — Other Ambulatory Visit

## 2024-04-21 VITALS — BP 130/78 | Ht 61.0 in | Wt 167.0 lb

## 2024-04-21 DIAGNOSIS — Z6835 Body mass index (BMI) 35.0-35.9, adult: Secondary | ICD-10-CM

## 2024-04-21 DIAGNOSIS — E039 Hypothyroidism, unspecified: Secondary | ICD-10-CM

## 2024-04-21 DIAGNOSIS — E66812 Obesity, class 2: Secondary | ICD-10-CM

## 2024-04-21 LAB — TSH: TSH: 0.06 m[IU]/L — ABNORMAL LOW (ref 0.40–4.50)

## 2024-04-21 LAB — T4, FREE: Free T4: 1.7 ng/dL (ref 0.8–1.8)

## 2024-04-21 NOTE — Progress Notes (Unsigned)
 Name: Allison [name]  MRN/ DOB: [id number], [birth year]    Age/ Sex: 63 y.o., female    PCP: Hodnett, Recardo SQUIBB, FNP   Reason for Endocrinology Evaluation: Hypothyroidism     Date of Initial Endocrinology Evaluation: 07/25/2021    HPI: Allison [name] is a 63 y.o. female with a past medical history of RA and Hypothyroidism. The patient presented for initial endocrinology clinic visit on 07/25/2021 for consultative assistance with her Hypothyroidism.   She has been diagnosed with hypothyroidism in her adult life . She has been on Levothyroxine   for years   She has noted local neck symptoms  with intermittent swelling. Has been diagnosed with right thyroid  nodule in 2019 , was lost to follow-up after that, no history of FNAs   In January 2023 she has been noted with a TSH of 0.109 u IU/mL, she was on levothyroxine  175 +88 mcg daily, this was reduced to levothyroxine  175 +25 mcg with repeat TSH 0.069 uIU/mL  We have reduced her levothyroxine  dose to optimize TSH  Repeat ultrasound on May/12/2021 revealed a stable 1.5 right centimeter thyroid  nodule but due to peripheral calcifications and FNA has been recommended.  Unfortunately biopsy had to be halted after 3 attempts due to the patient having sore throat. Another attempt was made 12/2021 but the sample was acellular   No family history of thyroid  disease   Transferred care from Dr. Nichole  OBESITY:  The patient was started on Wegovy  in July, 2025 due to noted weight gain with a weight of 196 LB's at the time and a BMI of 35.85 kg/m2  SUBJECTIVE:    Today (04/21/24): Allison [name] is here for follow-up on hypothyroidism and right thyroid  nodule.  The patient continues to follow-up with rheumatology for seropositive RA on hydrochloric when and Celebrex .  The patient has been noted with approximately 30 LB weight loss since her last visit here in July  She does have nausea , she is on Omeprazole   She also has changes in bowel movements  between constipation or diarrhea  No change in  local neck swelling  Has mild stable palpitations  She is on a high protein , low carb diet  No tremors  She is on MVI and Iron   Levothyroxine  150 mcg, 2 tabs on Sundays and 1 tab rest of the week  Wegovy  0.5 mg weekly   HISTORY:  Past Medical History:  Past Medical History:  Diagnosis Date   Anemia    Anxiety    COPD (chronic obstructive pulmonary disease) (HCC)    Depression    GERD (gastroesophageal reflux disease)    diet controlled , tums occasional   Hemorrhoids    Hyperlipidemia    Hypertension    Kidney stones    Neuromuscular disorder (HCC)    Rheumatoid arthritis (HCC)    Thyroid  disease    Past Surgical History:  Past Surgical History:  Procedure Laterality Date   APPENDECTOMY     arm surgery Right    nerve moved   BACK SURGERY     x 2 - lumbar - replaced 2 disc   CHOLECYSTECTOMY     COLONOSCOPY  07/2014   hx polyps [place]   kidney stones     x 2 - removed by surgery   LAPAROTOMY     cyst removed from ovary   SHOULDER SURGERY Right    SPINE SURGERY     TUBAL LIGATION     WISDOM  TOOTH EXTRACTION      Social History:  reports that she quit smoking about 5 years ago. Her smoking use included cigarettes. She started smoking about 45 years ago. She has a 40 pack-year smoking history. She has been exposed to tobacco smoke. She has never used smokeless tobacco. She reports current alcohol use. She reports that she does not use drugs. Family History: family history includes Anxiety disorder in her mother; Asthma in her mother; Colon polyps in her brother and brother; Depression in her brother; Heart disease in her brother and father; Miscarriages / Stillbirths in her mother; Obesity in her sister; Osteoarthritis in her sister; Thyroid  disease in her daughter.   HOME MEDICATIONS: Allergies as of 04/21/2024       Reactions   Ezetimibe Other (See Comments)   Latex Itching   Penicillin G Hives   Sulfa  Antibiotics Hives        Medication List        Accurate as of April 21, 2024  9:52 AM. If you have any questions, ask your nurse or doctor.          ALPRAZolam 0.5 MG tablet Commonly known as: XANAX 1 tab(s)   amitriptyline 10 MG tablet Commonly known as: ELAVIL   aspirin EC 81 MG tablet TAKE 1 TABLET BY MOUTH EVERY DAY   atorvastatin 80 MG tablet Commonly known as: LIPITOR TAKE 1 TABLET BY MOUTH ONE TIME DAILY AT BEDTIME   celecoxib  200 MG capsule Commonly known as: CELEBREX  TAKE 1 CAPSULE BY MOUTH EVERY DAY AS NEEDED   cetirizine 10 MG tablet Commonly known as: ZYRTEC Take 10 mg by mouth as needed.   DULoxetine 20 MG capsule Commonly known as: CYMBALTA Take 20 mg by mouth daily.   FIBER PO Take by mouth 2 (two) times daily.   gabapentin  300 MG capsule Commonly known as: NEURONTIN  Take 1 capsule (300 mg total) by mouth 2 (two) times daily.   hydroxychloroquine  200 MG tablet Commonly known as: PLAQUENIL  Take 2 tablets (400 mg total) by mouth daily.   IRON PO Take by mouth daily.   levalbuterol 45 MCG/ACT inhaler Commonly known as: XOPENEX HFA Inhale 2 puffs into the lungs every 6 (six) hours as needed for wheezing.   levothyroxine  150 MCG tablet Commonly known as: Synthroid  2 tabs on Sundays and 1 tablet rest of the week   MULTIVITAMIN PO Take by mouth daily.   omeprazole  20 MG capsule Commonly known as: PRILOSEC TAKE 1 CAPSULE (20 MG TOTAL) BY MOUTH DAILY. PLEASE SCHEDULE A YEARLY FOLLOW UP FOR FURTHER REFILLS. THANK YOU   sertraline 100 MG tablet Commonly known as: ZOLOFT 1 tab(s)   triamcinolone 55 MCG/ACT Aero nasal inhaler Commonly known as: NASACORT SPRAY 2 SPRAYS EVERY DAY BY INTRANASAL ROUTE FOR 45 DAYS.   valsartan  80 MG tablet Commonly known as: DIOVAN  TAKE 1 TABLET BY MOUTH EVERY DAY   Wegovy  0.5 MG/0.5ML Soaj SQ injection Generic drug: semaglutide -weight management Inject 0.5 mg into the skin once a week.           REVIEW OF SYSTEMS: A comprehensive ROS was conducted with the patient and is negative except as per HPI    OBJECTIVE:  VS: BP 130/78   Ht 5' 1 (1.549 m)   Wt 167 lb (75.8 kg)   BMI 31.55 kg/m    Wt Readings from Last 3 Encounters:  04/21/24 167 lb (75.8 kg)  04/07/24 173 lb (78.5 kg)  03/17/24 175 lb 6.4 oz (  79.6 kg)     EXAM: General: Pt appears well and is in NAD  Neck: General: Supple without adenopathy. Thyroid : Minimal asymmetry on the right, but unable to appreciate an actual nodule  Lungs: Clear with good BS bilat   Heart: Auscultation: RRR.  Extremities:  BL LE: Edema resolved pretibial edema   Mental Status: Judgment, insight: Intact Orientation: Oriented to time, place, and person Mood and affect: No depression, anxiety, or agitation     DATA REVIEWED: *****   Thyroid  ultrasound 12/17/2023  FINDINGS: Parenchymal Echotexture: Moderately heterogenous   Isthmus: 0.5 cm   Right lobe: 5.8 x 2.3 x 2.2 cm   Left lobe: 4.9 x 1.8 x 2.2 cm   _________________________________________________________   Estimated total number of nodules >/= 1 cm: 1   Number of spongiform nodules >/=  2 cm not described below (TR1): 0   Number of mixed cystic and solid nodules >/= 1.5 cm not described below (TR2): 0   _________________________________________________________   Nodule 1: 1.6 x 1.5 x 1.5 cm calcified nodule of the inferior RIGHT thyroid  lobe is not significantly changed in size since 06/22/2017 where it measured 1.4 x 1.0 x 1.4 cm. This indicates a benign etiology.   No new thyroid  nodule identified.   IMPRESSION: 1.6 cm RIGHT inferior thyroid  nodule does not demonstrate significant change in size since 2019, which is consistent with a benign etiology.      FNA 12/18/2021  Clinical History: Nodule #1: Right; Inferior, Maximum size: 1.5 cm,  previously 1.7 cm; Other 2 dimensions: 1.3 x 1.0 cm, solid/almost  completely solid, isoechoic, Echogenic  foci: peripheral calcifications,  TI-RADS total points 5. Previously biopsied 10/02/21  Specimen Submitted:  A. THYROID , RT INFERIOR, FINE NEEDLE ASPIRATION:    FINAL MICROSCOPIC DIAGNOSIS:  - Nondiagnostic material   SPECIMEN ADEQUACY:  INSUFFICIENT FOR DIAGNOSIS. The specimen is processed and examined  microscopically, but found to be UNSATISFACTORY for evaluation.   DIAGNOSTIC COMMENTS:  The specimen is essentially acellular.   ASSESSMENT/PLAN/RECOMMENDATIONS:   Hypothyroidism:  -Pt is euthyroid  - *****  Medications :  levothyroxine  150 mcg, 2 tabs on Sundays and 1 tablet rest of the week  2. Right thyroid  nodule   - Right inferior 1.5 cm nodule  with calcifications, an attempt at FNA was unsuccessful  in 09/2021 as the pt did not tolerate the procedure. S/P FNA in 12/2021 with insufficient cellularity  -Repeat thyroid  ultrasound 12/2023 confirm stability of the nodule -We had discussed the high risk of malignancy with calcifications, patient is not interested in thyroid  surgery. - Calcitonin levels have been undetectable    3.  Obesity: BMI 31.5  -Patient has lost approximately 30 LB's since her last visit here - BMI down from 35 to 31  -She is on a high-protein, low-carb diet, she has been eating more fresh fruits and vegetables, she is also on a low-salt diet - She is having mild GI side effects, tolerable at this time, will not increase the dose of Wegovy  -I did encourage the patient to incorporate strengthening exercises 3 times weekly, 20 minutes each   Medication Continue Wegovy  0.5 mg weekly  Follow-up in 6 months  Signed electronically by: Stefano Redgie Butts, MD  [facility] End[facility]  [facility] [address] [place] [place], [place] [address] Phone: [phone] FAX: [phone]   CC: Hodnett, Recardo SQUIBB, FNP [address] [place] [address] Phone: [phone] Fax: [phone]   Return to Endocrinology clinic as  below: Future Appointments  Date Time Provider Department Center  04/21/2024 10:10 AM Dragon Thrush, Donell Cardinal, MD [facility] None  09/15/2024 10:20 AM Rice, Lonni ORN, MD [facility] None

## 2024-04-24 ENCOUNTER — Ambulatory Visit: Payer: Self-pay | Admitting: Internal Medicine

## 2024-04-24 MED ORDER — LEVOTHYROXINE SODIUM 150 MCG PO TABS: 150.0000 ug | ORAL_TABLET | Freq: Every day | ORAL | 3 refills | Status: AC

## 2024-05-14 ENCOUNTER — Other Ambulatory Visit: Payer: Self-pay | Admitting: Gastroenterology

## 2024-05-18 ENCOUNTER — Other Ambulatory Visit: Payer: Self-pay | Admitting: Emergency Medicine

## 2024-05-30 ENCOUNTER — Other Ambulatory Visit: Payer: Self-pay | Admitting: Internal Medicine

## 2024-05-30 DIAGNOSIS — M0579 Rheumatoid arthritis with rheumatoid factor of multiple sites without organ or systems involvement: Secondary | ICD-10-CM

## 2024-05-30 NOTE — Telephone Encounter (Signed)
 Last Fill: 03/06/2024  Labs: 09/10/2023 CBC and CMP WNL  Next Visit: 09/15/2024  Last Visit: 03/17/2024  DX: Rheumatoid arthritis with rheumatoid factor of multiple sites without organ or systems involvement   Current Dose per office note 03/17/2024: celebrex  200 mg PRN   Okay to refill Celebrex ?  Contacted patient to advise that labs are due to be updated, provided lab hours

## 2024-09-15 ENCOUNTER — Ambulatory Visit: Admitting: Internal Medicine

## 2024-10-20 ENCOUNTER — Ambulatory Visit: Admitting: Internal Medicine
